# Patient Record
Sex: Female | Born: 1950 | Race: Black or African American | Hispanic: No | Smoking: Never smoker
Health system: Southern US, Community
[De-identification: ages and names within clinical notes are randomized; demographics above are authoritative.]

## PROBLEM LIST (undated history)

## (undated) DIAGNOSIS — Z923 Personal history of irradiation: Secondary | ICD-10-CM

## (undated) DIAGNOSIS — H269 Unspecified cataract: Secondary | ICD-10-CM

## (undated) DIAGNOSIS — R112 Nausea with vomiting, unspecified: Secondary | ICD-10-CM

## (undated) DIAGNOSIS — K219 Gastro-esophageal reflux disease without esophagitis: Secondary | ICD-10-CM

## (undated) DIAGNOSIS — C801 Malignant (primary) neoplasm, unspecified: Secondary | ICD-10-CM

## (undated) DIAGNOSIS — G4730 Sleep apnea, unspecified: Secondary | ICD-10-CM

## (undated) DIAGNOSIS — F419 Anxiety disorder, unspecified: Secondary | ICD-10-CM

## (undated) DIAGNOSIS — T7840XA Allergy, unspecified, initial encounter: Secondary | ICD-10-CM

## (undated) DIAGNOSIS — D649 Anemia, unspecified: Secondary | ICD-10-CM

## (undated) DIAGNOSIS — M1990 Unspecified osteoarthritis, unspecified site: Secondary | ICD-10-CM

## (undated) DIAGNOSIS — I10 Essential (primary) hypertension: Secondary | ICD-10-CM

## (undated) DIAGNOSIS — Z9889 Other specified postprocedural states: Secondary | ICD-10-CM

## (undated) HISTORY — PX: KIDNEY SURGERY: SHX687

## (undated) HISTORY — PX: ABDOMINAL HYSTERECTOMY: SHX81

## (undated) HISTORY — DX: Unspecified cataract: H26.9

## (undated) HISTORY — PX: BACK SURGERY: SHX140

## (undated) HISTORY — DX: Allergy, unspecified, initial encounter: T78.40XA

## (undated) HISTORY — PX: COLONOSCOPY: SHX174

## (undated) HISTORY — PX: FOOT GANGLION EXCISION: SHX1660

## (undated) HISTORY — DX: Anemia, unspecified: D64.9

## (undated) HISTORY — PX: BREAST SURGERY: SHX581

## (undated) HISTORY — PX: FOOT SURGERY: SHX648

---

## 1998-01-17 ENCOUNTER — Other Ambulatory Visit: Admission: RE | Admit: 1998-01-17 | Discharge: 1998-01-17 | Payer: Self-pay | Admitting: Family Medicine

## 1998-01-28 DIAGNOSIS — Z923 Personal history of irradiation: Secondary | ICD-10-CM

## 1998-01-28 HISTORY — DX: Personal history of irradiation: Z92.3

## 1998-01-28 HISTORY — PX: BREAST LUMPECTOMY: SHX2

## 1998-01-28 HISTORY — PX: BREAST SURGERY: SHX581

## 1998-03-08 ENCOUNTER — Encounter: Payer: Self-pay | Admitting: General Surgery

## 1998-03-08 ENCOUNTER — Ambulatory Visit (HOSPITAL_COMMUNITY): Admission: RE | Admit: 1998-03-08 | Discharge: 1998-03-08 | Payer: Self-pay | Admitting: General Surgery

## 1998-03-21 ENCOUNTER — Encounter: Payer: Self-pay | Admitting: General Surgery

## 1998-03-23 ENCOUNTER — Encounter: Payer: Self-pay | Admitting: General Surgery

## 1998-03-23 ENCOUNTER — Ambulatory Visit (HOSPITAL_COMMUNITY): Admission: RE | Admit: 1998-03-23 | Discharge: 1998-03-24 | Payer: Self-pay | Admitting: General Surgery

## 1998-04-05 ENCOUNTER — Encounter: Admission: RE | Admit: 1998-04-05 | Discharge: 1998-07-04 | Payer: Self-pay | Admitting: Radiation Oncology

## 1999-02-26 ENCOUNTER — Other Ambulatory Visit: Admission: RE | Admit: 1999-02-26 | Discharge: 1999-02-26 | Payer: Self-pay | Admitting: Gynecology

## 1999-09-11 ENCOUNTER — Encounter: Payer: Self-pay | Admitting: Gynecology

## 1999-09-17 ENCOUNTER — Encounter (INDEPENDENT_AMBULATORY_CARE_PROVIDER_SITE_OTHER): Payer: Self-pay

## 1999-09-17 ENCOUNTER — Encounter (INDEPENDENT_AMBULATORY_CARE_PROVIDER_SITE_OTHER): Payer: Self-pay | Admitting: Specialist

## 1999-09-17 ENCOUNTER — Inpatient Hospital Stay (HOSPITAL_COMMUNITY): Admission: RE | Admit: 1999-09-17 | Discharge: 1999-09-20 | Payer: Self-pay | Admitting: Gynecology

## 2002-05-10 ENCOUNTER — Other Ambulatory Visit: Admission: RE | Admit: 2002-05-10 | Discharge: 2002-05-10 | Payer: Self-pay | Admitting: Gynecology

## 2004-04-13 ENCOUNTER — Ambulatory Visit: Payer: Self-pay | Admitting: Oncology

## 2005-05-03 ENCOUNTER — Ambulatory Visit: Payer: Self-pay | Admitting: Oncology

## 2006-03-17 ENCOUNTER — Ambulatory Visit: Payer: Self-pay | Admitting: Gastroenterology

## 2006-04-22 ENCOUNTER — Ambulatory Visit: Payer: Self-pay | Admitting: Gastroenterology

## 2006-05-13 ENCOUNTER — Ambulatory Visit: Payer: Self-pay | Admitting: Oncology

## 2006-07-21 ENCOUNTER — Ambulatory Visit: Payer: Self-pay | Admitting: Oncology

## 2006-07-31 ENCOUNTER — Encounter: Admission: RE | Admit: 2006-07-31 | Discharge: 2006-07-31 | Payer: Self-pay | Admitting: Oncology

## 2006-09-12 ENCOUNTER — Ambulatory Visit: Payer: Self-pay | Admitting: Oncology

## 2007-07-14 ENCOUNTER — Encounter: Admission: RE | Admit: 2007-07-14 | Discharge: 2007-07-14 | Payer: Self-pay | Admitting: Oncology

## 2008-05-27 ENCOUNTER — Ambulatory Visit: Payer: Self-pay | Admitting: Vascular Surgery

## 2008-05-27 ENCOUNTER — Ambulatory Visit (HOSPITAL_COMMUNITY): Admission: RE | Admit: 2008-05-27 | Discharge: 2008-05-27 | Payer: Self-pay | Admitting: Orthopedic Surgery

## 2008-05-27 ENCOUNTER — Encounter (INDEPENDENT_AMBULATORY_CARE_PROVIDER_SITE_OTHER): Payer: Self-pay | Admitting: Orthopedic Surgery

## 2008-07-28 ENCOUNTER — Encounter: Admission: RE | Admit: 2008-07-28 | Discharge: 2008-07-28 | Payer: Self-pay | Admitting: Oncology

## 2009-08-02 ENCOUNTER — Encounter: Admission: RE | Admit: 2009-08-02 | Discharge: 2009-08-02 | Payer: Self-pay | Admitting: Gynecology

## 2010-06-15 NOTE — Op Note (Signed)
[facility]  Patient:    Brittany Bender, Brittany Bender                 MRN: [id number] Proc. Date: 09/17/99 Adm. Date:  [id number] Disc. Date: [id number] Attending:  Susa Raring                           Operative Report  PREOPERATIVE DIAGNOSES: 1. Pelvic mass, presumed to be ovarian. 2. History of breast cancer.  POSTOPERATIVE DIAGNOSES: 1. Bilateral hydrosalpinx. 2. Chronic pelvic inflammatory disease. 3. Multiple uterine myomata.  OPERATION:  Total abdominal hysterectomy, bilateral salpingo-oophorectomy, lysis of adhesions.  SURGEON:  Luvenia Redden, M.D.  ASSISTANT:  Brook A. Edward Jolly, M.D.  ANESTHESIA:  General endotracheal.  DESCRIPTION OF PROCEDURE:  Under good anesthesia, the patient was prepped and draped in a sterile manner.  Foley catheter was placed in the bladder and was draining clear urine.  A midline incision was made, carried down to the underlying layers.  Bleeders were clamped and cauterized as encountered.  The anterior fascia was entered vertically and opened.  The rectus muscles spread from the midline.  The peritoneum was entered sharply and the opening then extended vertically.  Pelvic washings were taken, and these were sent to cytology for permanent sections.  Exploration of the upper abdomen revealed no masses; the liver was smooth.  Pelvic peritoneum was smooth.  In the pelvis, there was a large mass which was comprised of a pedunculated fibroid on the fundus of the uterus and several other intramural fibroids present.  There was a large cystic mass in the right adnexal area, and upon examining it, the ovary appeared normal, and on the cystic mass there was a large, distended hydrosalpinx.  On the left side, the left ovary was normal as well.  There was a hydrosalpinx due to tubal occlusion in the left tube as well, although this was of a much smaller degree.  Adhesions were lysed and specimen freed up.  In order to gain  exposure, the intestines were packed off into the upper abdomen with wet lap packs once the retractor had been placed.  Again, in order to gain better exposure, pedunculated myoma was excised, and this was sent as a separate specimen.  Then we were able to adequately visualize the cystic mass on the right side which was even more apparently a hydrosalpinx.  The infundibulopelvic ligament was identified.  The ureter was identified which was well below the operative site.  The fallopian tube and uteroovarian ligaments on the right side were clamped doubly and then divided.  Another clamp was placed on the mesosalpinx so that the ovary and tube were freed up even more, and the infundibulopelvic ligament was isolated.  The ureter was again viewed.  The infundibulopelvic ligament was then clamped, cut, and doubly ligated.  Specimen was sent to pathology for examination, and report was returned as a benign hydrosalpinx and that the ovary was normal. Following this, the left ovary and tube were freed up from their adhesions. The round ligament was clamped, cut, and ligated on both sides, and then the bladder flap was developed by sharp and blunt dissection.  The ureter was identified on the left side.  The infundibulopelvic ligament identified and isolated.  It was clamped, cut, and doubly ligated.  The uterine vessels were skeletonized, clamped, cut, and ligated.  The cardinal ligaments were then clamped, cut, and ligated in two bites.  The uterosacral ligaments  were clamped, cut, and ligated.  The vagina was entered, the cervix circumcised, and the specimen removed.  Lateral vaginal apices were secured with a figure-of-eight Monocryl sutures.  The cuff was then closed with figure-of-eight Monocryl sutures.  The pelvis was irrigated then with copious amounts of warm saline, and this was aspirated.  Small bleeders on the bladder flap were electrocoagulated.  Ureters were again viewed and seen to  be functioning.  Operative site was dry, and there was clear urine coming from the Foley catheter.  The packs were removed, the intestines and omentum placed back down into the lower abdomen and pelvis.  After all of the counts were correct, the peritoneum was closed with a continuous 2-0 Monocryl suture.  The fascia was closed with continuous #1 PDS, and this closure was done halfway with a single suture, and then another suture was used to do the other half. Once the fascia was closed, the subcutaneous area was irrigated again. Bleeders were coagulated.  Subcutaneous space was closed with interrupted 2-0 plain catgut sutures.  The skin edges were then approximated with wide skin staples.  Estimated blood loss was 500 cc.  The patient was draining clear urine at the end of the procedure.  She tolerated the procedure well.  She was removed to recovery in good condition. DD:  10/28/99 TD:  10/28/99 Job: [id number]

## 2010-06-15 NOTE — Discharge Summary (Signed)
[facility]  Patient:    Brittany Bender, Brittany Bender                 MRN: [id number] Adm. Date:  [id number] Disc. Date: [id number] Attending:  Susa Raring                           Discharge Summary  HISTORY OF PRESENT ILLNESS:  This patient is a 60 year old black female admitted to the hospital because of a large cystic pelvic mass.  She has a history of breast cancer dating back several years, and there is no evidence of recurrent breast disease; however, the patient came into the office complaining of a some slight vaginal spotting.  She is taking tamoxifen.  An ultrasound revealed this large cystic mass.  A CA-125 is normal.  The patient was admitted for removal of this pelvic mass.  PERTINENT FINDINGS ON PHYSICAL EXAMINATION:  GENERAL APPEARANCE:  A large black female in no acute distress.  GENITOURINARY:  She had a somewhat palpable mass in her pelvis, but because of the size of this lady it was just difficult to outline it.  The cervix was smooth.  There were no other pertinent findings on physical exam.  LABORATORY AND X-RAY DATA:  On ultrasound her endometrial stripe was normal. Preoperative hemoglobin was 11 with a normal white count and differential, normal platelets.  Postop hemoglobin was 9.6.  Repeat hemoglobin on August 22 was 9.4.  Repeat hemoglobin on August 23 was reported as 11.0.  Differentials were all normal.  Her urinalysis was normal for a voided specimen.  Preop EKG strip was within normal limits, and a chest film was within normal limits.  HOSPITAL COURSE:  The patient was admitted actually through Day Surgery.  She was taken to the operating room where a total abdominal hysterectomy-bilateral salpingo-oophorectomy was performed.  Peritoneal washings were also taken. Frozen section for the large cystic mass showed a benign hydrosalpinx.  The patient tolerated the procedure well.  There was about 500 cc blood loss, and she  did show a drop in her hemoglobin postop.  Postop course was actually fairly benign other than the drop in her hemoglobin.  Everything was pretty benign.  On the first postop day she was getting good pain relief from her morphine, and she was taking some p.o. fluids by August 21.  She did spike a temp up to 101, and she had some nausea due to her morphine, and we changed that to Demerol.  Temp elevation remitted spontaneously, and a CBC white count was within normal limits.  September 19, 1999, she still had scan bowel sounds and taking some fluids orally, but had yet to pass any flatus and was not ambulating well.  However, August 23 she was afebrile.  A repeat CBC and diff showed that it was normal.  Her fever had remitted spontaneously so we felt that was due to atelectasis.  The tissue removed was all benign.  She did have a chronic PID with bilateral hydrosalpinx and multiple uterine leiomyoma.  The endometrium was benign.  The cell block of pelvic washings was also benign.  DISCHARGE INSTRUCTIONS:  The patient was discharged to home with instructions as to limited activity, eat a regular diet, do not lifting, no vaginal penetration.  DISCHARGE MEDICATIONS:  She was given a prescription for [facility] for pain.  SPECIAL INSTRUCTIONS:  I also made arrangements with her to go by her residence in  three days time to remove her skin staples.  FINAL DIAGNOSES: 1. Bilateral benign hydrosalpinx. 2. Chronic pelvic inflammation disease. 3. Multiple uterine leiomyomata. 4. Anemia secondary to blood loss. 5. Posttreatment status for breast cancer.  OPERATIONS PERFORMED: 1. Total abdominal hysterectomy and bilateral salpingo-oophorectomy. 2. Lysis of adhesions. 3. Pelvic washings for cytology.  CONDITION ON DISCHARGE:  Improved. DD:  10/28/99 TD:  10/28/99 Job: [id number]

## 2010-07-09 ENCOUNTER — Other Ambulatory Visit: Payer: Self-pay | Admitting: Oncology

## 2010-07-09 DIAGNOSIS — Z1231 Encounter for screening mammogram for malignant neoplasm of breast: Secondary | ICD-10-CM

## 2010-08-06 ENCOUNTER — Ambulatory Visit
Admission: RE | Admit: 2010-08-06 | Discharge: 2010-08-06 | Disposition: A | Discharge status: Home / Self Care | Payer: BC Managed Care – PPO | Source: Ambulatory Visit | Attending: Oncology | Admitting: Oncology

## 2010-08-06 DIAGNOSIS — Z1231 Encounter for screening mammogram for malignant neoplasm of breast: Secondary | ICD-10-CM

## 2010-11-29 ENCOUNTER — Other Ambulatory Visit: Payer: Self-pay | Admitting: Chiropractic Medicine

## 2010-11-29 ENCOUNTER — Ambulatory Visit
Admission: RE | Admit: 2010-11-29 | Discharge: 2010-11-29 | Disposition: A | Discharge status: Home / Self Care | Payer: BC Managed Care – PPO | Source: Ambulatory Visit | Attending: Chiropractic Medicine | Admitting: Chiropractic Medicine

## 2010-11-29 DIAGNOSIS — M545 Low back pain, unspecified: Secondary | ICD-10-CM

## 2010-12-06 ENCOUNTER — Other Ambulatory Visit: Payer: Self-pay | Admitting: Neurosurgery

## 2010-12-14 ENCOUNTER — Encounter (HOSPITAL_COMMUNITY): Payer: Self-pay

## 2010-12-17 ENCOUNTER — Encounter (HOSPITAL_COMMUNITY)
Admission: RE | Admit: 2010-12-17 | Discharge: 2010-12-17 | Disposition: A | Discharge status: Home / Self Care | Payer: BC Managed Care – PPO | Source: Ambulatory Visit | Attending: Neurosurgery | Admitting: Neurosurgery

## 2010-12-17 ENCOUNTER — Encounter (HOSPITAL_COMMUNITY): Payer: Self-pay

## 2010-12-17 ENCOUNTER — Other Ambulatory Visit (HOSPITAL_COMMUNITY): Payer: Self-pay | Admitting: *Deleted

## 2010-12-17 HISTORY — DX: Other specified postprocedural states: Z98.890

## 2010-12-17 HISTORY — DX: Unspecified osteoarthritis, unspecified site: M19.90

## 2010-12-17 HISTORY — DX: Other specified postprocedural states: R11.2

## 2010-12-17 HISTORY — DX: Gastro-esophageal reflux disease without esophagitis: K21.9

## 2010-12-17 HISTORY — DX: Malignant (primary) neoplasm, unspecified: C80.1

## 2010-12-17 LAB — CBC
MCH: 29.7 pg (ref 26.0–34.0)
MCHC: 32.8 g/dL (ref 30.0–36.0)
Platelets: 238 10*3/uL (ref 150–400)
RBC: 3.74 MIL/uL — ABNORMAL LOW (ref 3.87–5.11)

## 2010-12-17 LAB — SURGICAL PCR SCREEN
MRSA, PCR: NEGATIVE
Staphylococcus aureus: NEGATIVE

## 2010-12-17 NOTE — Pre-Procedure Instructions (Signed)
20 Brittany Bender  12/17/2010   Your procedure is scheduled on:  12/24/2010  Report to [facility] at 5:30 AM.  Call this number if you have problems the morning of surgery: [phone]   Remember: Discontinue Aspirin, Coumadin, Plavix, Effient and herbal medications.    Do not eat food:After Midnight.             You can drink clear liquids (water, soda, tea, black coffee, apple juice:Up to  4 Hours before arrival (1:30 a.m.)  Take these medicines the morning of surgery with A SIP OF WATER: Inhaler-use if needed, Robaxin   Do not wear jewelry, make-up or nail polish.  Do not wear lotions, powders, or perfumes. You may wear deodorant.  Do not shave 48 hours prior to surgery.  Do not bring valuables to the hospital.  Contacts, dentures or bridgework may not be worn into surgery.  Leave suitcase in the car. After surgery it may be brought to your room.  For patients admitted to the hospital, checkout time is 11:00 AM the day of discharge.   Patients discharged the day of surgery will not be allowed to drive home.  Name and phone number of your driver: Berneta Sages [facility] house [phone]  Special Instructions: CHG Shower Use Special Wash: 1/2 bottle night before surgery and 1/2 bottle morning of surgery.   Please read over the following fact sheets that you were given: Pain Booklet, Coughing and Deep Breathing, MRSA Information and Surgical Site Infection Prevention

## 2010-12-17 NOTE — Pre-Procedure Instructions (Signed)
20 Brittany Bender  12/17/2010   Your procedure is scheduled on: 12-24-2010  Report to [facility] at 5:30 AM.  Call this number if you have problems the morning of surgery: [phone]   Remember:   Do not eat food:After Midnight.  Do not drink clear liquids: 4 Hours before arrival.  MAY HAVE WATER, SODA,TEA,BLACK COFFEE,BROTH,APPLE JUICE OR GRAPE JUICE  UNTIL 1:30 AM  Take these medicines the morning of surgery with A SIP OF WATER: ALBUTERAL INHALER IF NEEDED   Do not wear jewelry, make-up or nail polish.  Do not wear lotions, powders, or perfumes. You may wear deodorant.  Do not shave 48 hours prior to surgery.  Do not bring valuables to the hospital.  Contacts, dentures or bridgework may not be worn into surgery.  Leave suitcase in the car. After surgery it may be brought to your room.  For patients admitted to the hospital, checkout time is 11:00 AM the day of discharge.   Patients discharged the day of surgery will not be allowed to drive home.  Name and phone number of your driver:   Special Instructions: CHG Shower Use Special Wash: 1/2 bottle night before surgery and 1/2 bottle morning of surgery.   Please read over the following fact sheets that you were given: Pain Booklet, MRSA Information and Surgical Site Infection Prevention

## 2010-12-23 MED ORDER — CEFAZOLIN SODIUM 1-5 GM-% IV SOLN: 1.0000 g | INTRAVENOUS | Status: DC

## 2010-12-24 ENCOUNTER — Encounter (HOSPITAL_COMMUNITY): Payer: Self-pay | Admitting: Certified Registered"

## 2010-12-24 ENCOUNTER — Encounter (HOSPITAL_COMMUNITY)
Admission: RE | Disposition: A | Discharge status: Home / Self Care | Payer: Self-pay | Source: Ambulatory Visit | Attending: Neurosurgery

## 2010-12-24 ENCOUNTER — Ambulatory Visit (HOSPITAL_COMMUNITY): Payer: BC Managed Care – PPO | Admitting: Certified Registered"

## 2010-12-24 ENCOUNTER — Ambulatory Visit (HOSPITAL_COMMUNITY)
Admission: RE | Admit: 2010-12-24 | Discharge: 2010-12-24 | Disposition: A | Discharge status: Home / Self Care | Payer: BC Managed Care – PPO | Source: Ambulatory Visit | Attending: Neurosurgery | Admitting: Neurosurgery

## 2010-12-24 ENCOUNTER — Ambulatory Visit (HOSPITAL_COMMUNITY): Payer: BC Managed Care – PPO

## 2010-12-24 ENCOUNTER — Encounter (HOSPITAL_COMMUNITY): Payer: Self-pay

## 2010-12-24 DIAGNOSIS — J45909 Unspecified asthma, uncomplicated: Secondary | ICD-10-CM | POA: Insufficient documentation

## 2010-12-24 DIAGNOSIS — M5106 Intervertebral disc disorders with myelopathy, lumbar region: Secondary | ICD-10-CM | POA: Insufficient documentation

## 2010-12-24 DIAGNOSIS — Z01812 Encounter for preprocedural laboratory examination: Secondary | ICD-10-CM | POA: Insufficient documentation

## 2010-12-24 DIAGNOSIS — K219 Gastro-esophageal reflux disease without esophagitis: Secondary | ICD-10-CM | POA: Insufficient documentation

## 2010-12-24 HISTORY — PX: LUMBAR LAMINECTOMY/DECOMPRESSION MICRODISCECTOMY: SHX5026

## 2010-12-24 SURGERY — LUMBAR LAMINECTOMY/DECOMPRESSION MICRODISCECTOMY
Anesthesia: General | Site: Back | Laterality: Right | Wound class: Clean

## 2010-12-24 MED ORDER — CEFAZOLIN SODIUM 1-5 GM-% IV SOLN
1.0000 g | Freq: Three times a day (TID) | INTRAVENOUS | Status: DC
  Administered 2010-12-24: 1 g via INTRAVENOUS
  Filled 2010-12-24: qty 50

## 2010-12-24 MED ORDER — PHENOL 1.4 % MT LIQD: 1.0000 | OROMUCOSAL | Status: DC | PRN

## 2010-12-24 MED ORDER — PROMETHAZINE HCL 25 MG PO TABS: 12.5000 mg | ORAL_TABLET | ORAL | Status: DC | PRN

## 2010-12-24 MED ORDER — SODIUM CHLORIDE 0.9 % IJ SOLN
3.0000 mL | Freq: Two times a day (BID) | INTRAMUSCULAR | Status: DC
  Administered 2010-12-24: 3 mL via INTRAVENOUS

## 2010-12-24 MED ORDER — SODIUM CHLORIDE 0.9 % IR SOLN
Status: DC | PRN
  Administered 2010-12-24: 1000 mL

## 2010-12-24 MED ORDER — KETOROLAC TROMETHAMINE 30 MG/ML IJ SOLN
INTRAMUSCULAR | Status: AC
  Administered 2010-12-24: 30 mg via INTRAVENOUS
  Filled 2010-12-24: qty 1

## 2010-12-24 MED ORDER — HYDROCODONE-ACETAMINOPHEN 5-325 MG PO TABS: 1.0000 | ORAL_TABLET | ORAL | Status: DC | PRN

## 2010-12-24 MED ORDER — PROPOFOL 10 MG/ML IV EMUL
INTRAVENOUS | Status: DC | PRN
  Administered 2010-12-24: 200 mg via INTRAVENOUS

## 2010-12-24 MED ORDER — KCL IN DEXTROSE-NACL 20-5-0.45 MEQ/L-%-% IV SOLN
INTRAVENOUS | Status: DC
  Filled 2010-12-24: qty 1000

## 2010-12-24 MED ORDER — PROMETHAZINE HCL 25 MG/ML IJ SOLN
12.5000 mg | INTRAMUSCULAR | Status: DC | PRN
  Administered 2010-12-24: 12.5 mg via INTRAVENOUS
  Filled 2010-12-24: qty 1

## 2010-12-24 MED ORDER — THERA M PLUS PO TABS
2.0000 | ORAL_TABLET | Freq: Every day | ORAL | Status: DC
  Filled 2010-12-24: qty 2

## 2010-12-24 MED ORDER — HETASTARCH-ELECTROLYTES 6 % IV SOLN
INTRAVENOUS | Status: DC | PRN
  Administered 2010-12-24: 09:00:00 via INTRAVENOUS

## 2010-12-24 MED ORDER — MENTHOL 3 MG MT LOZG: 1.0000 | LOZENGE | OROMUCOSAL | Status: DC | PRN

## 2010-12-24 MED ORDER — KETOROLAC TROMETHAMINE 30 MG/ML IJ SOLN
30.0000 mg | Freq: Four times a day (QID) | INTRAMUSCULAR | Status: DC
  Administered 2010-12-24: 30 mg via INTRAVENOUS
  Filled 2010-12-24: qty 1

## 2010-12-24 MED ORDER — ONDANSETRON HCL 4 MG/2ML IJ SOLN: 4.0000 mg | INTRAMUSCULAR | Status: DC | PRN

## 2010-12-24 MED ORDER — THROMBIN 5000 UNITS EX KIT
PACK | CUTANEOUS | Status: DC | PRN
  Administered 2010-12-24: 10000 [IU] via TOPICAL

## 2010-12-24 MED ORDER — NEOSTIGMINE METHYLSULFATE 1 MG/ML IJ SOLN
INTRAMUSCULAR | Status: DC | PRN
  Administered 2010-12-24: 4 mg via INTRAVENOUS

## 2010-12-24 MED ORDER — GLYCOPYRROLATE 0.2 MG/ML IJ SOLN
INTRAMUSCULAR | Status: DC | PRN
  Administered 2010-12-24: .6 mg via INTRAVENOUS

## 2010-12-24 MED ORDER — CYCLOBENZAPRINE HCL 10 MG PO TABS: 10.0000 mg | ORAL_TABLET | Freq: Three times a day (TID) | ORAL | Status: DC | PRN

## 2010-12-24 MED ORDER — ALUM & MAG HYDROXIDE-SIMETH 400-400-40 MG/5ML PO SUSP
30.0000 mL | Freq: Four times a day (QID) | ORAL | Status: DC | PRN
  Filled 2010-12-24: qty 30

## 2010-12-24 MED ORDER — DOCUSATE SODIUM 100 MG PO CAPS: 100.0000 mg | ORAL_CAPSULE | Freq: Two times a day (BID) | ORAL | Status: DC

## 2010-12-24 MED ORDER — ROCURONIUM BROMIDE 100 MG/10ML IV SOLN
INTRAVENOUS | Status: DC | PRN
  Administered 2010-12-24: 50 mg via INTRAVENOUS
  Administered 2010-12-24: 10 mg via INTRAVENOUS

## 2010-12-24 MED ORDER — CEFAZOLIN SODIUM-DEXTROSE 2-3 GM-% IV SOLR
INTRAVENOUS | Status: AC
  Filled 2010-12-24: qty 50

## 2010-12-24 MED ORDER — HEMOSTATIC AGENTS (NO CHARGE) OPTIME
TOPICAL | Status: DC | PRN
  Administered 2010-12-24: 1 via TOPICAL

## 2010-12-24 MED ORDER — ACETAMINOPHEN 325 MG PO TABS: 650.0000 mg | ORAL_TABLET | ORAL | Status: DC | PRN

## 2010-12-24 MED ORDER — LACTATED RINGERS IV SOLN
INTRAVENOUS | Status: DC | PRN
  Administered 2010-12-24 (×3): via INTRAVENOUS

## 2010-12-24 MED ORDER — ALBUTEROL SULFATE HFA 108 (90 BASE) MCG/ACT IN AERS
2.0000 | INHALATION_SPRAY | Freq: Four times a day (QID) | RESPIRATORY_TRACT | Status: DC | PRN
  Filled 2010-12-24: qty 6.7

## 2010-12-24 MED ORDER — MORPHINE SULFATE 4 MG/ML IJ SOLN: 1.0000 mg | INTRAMUSCULAR | Status: DC | PRN

## 2010-12-24 MED ORDER — PROMETHAZINE HCL 25 MG/ML IJ SOLN: 6.2500 mg | INTRAMUSCULAR | Status: DC | PRN

## 2010-12-24 MED ORDER — METHOCARBAMOL 500 MG PO TABS: 500.0000 mg | ORAL_TABLET | Freq: Four times a day (QID) | ORAL | Status: DC | PRN

## 2010-12-24 MED ORDER — PHENYLEPHRINE HCL 10 MG/ML IJ SOLN
INTRAMUSCULAR | Status: DC | PRN
  Administered 2010-12-24 (×2): 40 ug via INTRAVENOUS
  Administered 2010-12-24: 80 ug via INTRAVENOUS
  Administered 2010-12-24: 40 ug via INTRAVENOUS
  Administered 2010-12-24 (×2): 80 ug via INTRAVENOUS
  Administered 2010-12-24: 40 ug via INTRAVENOUS

## 2010-12-24 MED ORDER — MIDAZOLAM HCL 5 MG/5ML IJ SOLN
INTRAMUSCULAR | Status: DC | PRN
  Administered 2010-12-24: 2 mg via INTRAVENOUS

## 2010-12-24 MED ORDER — ONDANSETRON HCL 4 MG/2ML IJ SOLN
INTRAMUSCULAR | Status: DC | PRN
  Administered 2010-12-24: 4 mg via INTRAVENOUS

## 2010-12-24 MED ORDER — CEFAZOLIN SODIUM-DEXTROSE 2-3 GM-% IV SOLR
2.0000 g | Freq: Once | INTRAVENOUS | Status: AC
  Administered 2010-12-24: 2 g via INTRAVENOUS

## 2010-12-24 MED ORDER — LIDOCAINE-EPINEPHRINE 1 %-1:100000 IJ SOLN
INTRAMUSCULAR | Status: DC | PRN
  Administered 2010-12-24: 10 mL

## 2010-12-24 MED ORDER — KETOROLAC TROMETHAMINE 30 MG/ML IJ SOLN
30.0000 mg | Freq: Four times a day (QID) | INTRAMUSCULAR | Status: DC
  Administered 2010-12-24: 30 mg via INTRAVENOUS

## 2010-12-24 MED ORDER — SODIUM CHLORIDE 0.9 % IJ SOLN: 3.0000 mL | INTRAMUSCULAR | Status: DC | PRN

## 2010-12-24 MED ORDER — FENTANYL CITRATE 0.05 MG/ML IJ SOLN
INTRAMUSCULAR | Status: DC | PRN
  Administered 2010-12-24: 150 ug via INTRAVENOUS
  Administered 2010-12-24: 50 ug via INTRAVENOUS

## 2010-12-24 MED ORDER — ACETAMINOPHEN 650 MG RE SUPP: 650.0000 mg | RECTAL | Status: DC | PRN

## 2010-12-24 MED ORDER — OXYCODONE-ACETAMINOPHEN 5-325 MG PO TABS
1.0000 | ORAL_TABLET | ORAL | Status: DC | PRN
  Administered 2010-12-24: 2 via ORAL
  Filled 2010-12-24: qty 2

## 2010-12-24 MED ORDER — SODIUM CHLORIDE 0.9 % IR SOLN
Status: DC | PRN
  Administered 2010-12-24: 08:00:00

## 2010-12-24 MED ORDER — METHOCARBAMOL 100 MG/ML IJ SOLN
500.0000 mg | Freq: Four times a day (QID) | INTRAVENOUS | Status: DC | PRN
  Filled 2010-12-24: qty 5

## 2010-12-24 SURGICAL SUPPLY — 58 items
APL SKNCLS STERI-STRIP NONHPOA (GAUZE/BANDAGES/DRESSINGS) ×1
BAG DECANTER FOR FLEXI CONT (MISCELLANEOUS) ×2 IMPLANT
BENZOIN TINCTURE PRP APPL 2/3 (GAUZE/BANDAGES/DRESSINGS) ×1 IMPLANT
BLADE SURG ROTATE 9660 (MISCELLANEOUS) IMPLANT
BUR ACORN 6.0 ACORN (BURR) ×2 IMPLANT
BUR ACRON 5.0MM COATED (BURR) IMPLANT
CANISTER SUCTION 2500CC (MISCELLANEOUS) ×2 IMPLANT
CLOTH BEACON ORANGE TIMEOUT ST (SAFETY) ×2 IMPLANT
CONT SPEC 4OZ CLIKSEAL STRL BL (MISCELLANEOUS) IMPLANT
DRAPE LAPAROTOMY 100X72X124 (DRAPES) ×2 IMPLANT
DRAPE MICROSCOPE LEICA (MISCELLANEOUS) ×2 IMPLANT
DRAPE POUCH INSTRU U-SHP 10X18 (DRAPES) ×2 IMPLANT
DRAPE PROXIMA HALF (DRAPES) ×2 IMPLANT
DRESSING TELFA 8X3 (GAUZE/BANDAGES/DRESSINGS) ×1 IMPLANT
DURAPREP 26ML APPLICATOR (WOUND CARE) ×2 IMPLANT
ELECT REM PT RETURN 9FT ADLT (ELECTROSURGICAL) ×2
ELECTRODE REM PT RTRN 9FT ADLT (ELECTROSURGICAL) ×1 IMPLANT
GAUZE SPONGE 4X4 12PLY STRL LF (GAUZE/BANDAGES/DRESSINGS) ×1 IMPLANT
GAUZE SPONGE 4X4 16PLY XRAY LF (GAUZE/BANDAGES/DRESSINGS) IMPLANT
GLOVE BIOGEL PI IND STRL 7.0 (GLOVE) IMPLANT
GLOVE BIOGEL PI INDICATOR 7.0 (GLOVE) ×2
GLOVE ECLIPSE 7.5 STRL STRAW (GLOVE) ×2 IMPLANT
GLOVE ECLIPSE 8.5 STRL (GLOVE) ×1 IMPLANT
GLOVE EXAM NITRILE LRG STRL (GLOVE) IMPLANT
GLOVE EXAM NITRILE MD LF STRL (GLOVE) IMPLANT
GLOVE EXAM NITRILE XL STR (GLOVE) IMPLANT
GLOVE EXAM NITRILE XS STR PU (GLOVE) IMPLANT
GLOVE SURG SS PI 6.5 STRL IVOR (GLOVE) ×3 IMPLANT
GOWN BRE IMP SLV AUR LG STRL (GOWN DISPOSABLE) ×4 IMPLANT
GOWN BRE IMP SLV AUR XL STRL (GOWN DISPOSABLE) IMPLANT
GOWN STRL REIN 2XL LVL4 (GOWN DISPOSABLE) ×1 IMPLANT
KIT BASIN OR (CUSTOM PROCEDURE TRAY) ×2 IMPLANT
KIT ROOM TURNOVER OR (KITS) ×2 IMPLANT
NDL HYPO 18GX1.5 BLUNT FILL (NEEDLE) IMPLANT
NDL SPNL 20GX3.5 QUINCKE YW (NEEDLE) IMPLANT
NEEDLE HYPO 18GX1.5 BLUNT FILL (NEEDLE) IMPLANT
NEEDLE HYPO 22GX1.5 SAFETY (NEEDLE) ×4 IMPLANT
NEEDLE SPNL 20GX3.5 QUINCKE YW (NEEDLE) ×2 IMPLANT
NS IRRIG 1000ML POUR BTL (IV SOLUTION) ×2 IMPLANT
PACK LAMINECTOMY NEURO (CUSTOM PROCEDURE TRAY) ×2 IMPLANT
PAD ARMBOARD 7.5X6 YLW CONV (MISCELLANEOUS) ×6 IMPLANT
PATTIES SURGICAL .75X.75 (GAUZE/BANDAGES/DRESSINGS) ×2 IMPLANT
RUBBERBAND STERILE (MISCELLANEOUS) ×4 IMPLANT
SPONGE GAUZE 4X4 12PLY (GAUZE/BANDAGES/DRESSINGS) IMPLANT
SPONGE LAP 4X18 X RAY DECT (DISPOSABLE) IMPLANT
SPONGE SURGIFOAM ABS GEL SZ50 (HEMOSTASIS) ×1 IMPLANT
STRIP CLOSURE SKIN 1/2X4 (GAUZE/BANDAGES/DRESSINGS) ×1 IMPLANT
SUT PROLENE 6 0 BV (SUTURE) IMPLANT
SUT VIC AB 0 CT1 18XCR BRD8 (SUTURE) ×1 IMPLANT
SUT VIC AB 0 CT1 8-18 (SUTURE) ×2
SUT VIC AB 2-0 CP2 18 (SUTURE) ×2 IMPLANT
SUT VIC AB 3-0 SH 8-18 (SUTURE) ×1 IMPLANT
SYR 20CC LL (SYRINGE) ×2 IMPLANT
SYR 5ML LL (SYRINGE) IMPLANT
TAPE HYPAFIX 4 X10 (GAUZE/BANDAGES/DRESSINGS) ×2 IMPLANT
TOWEL OR 17X24 6PK STRL BLUE (TOWEL DISPOSABLE) ×2 IMPLANT
TOWEL OR 17X26 10 PK STRL BLUE (TOWEL DISPOSABLE) ×2 IMPLANT
WATER STERILE IRR 1000ML POUR (IV SOLUTION) ×2 IMPLANT

## 2010-12-24 NOTE — Preoperative (Signed)
Beta Blockers   Reason not to administer Beta Blockers:Not Applicable 

## 2010-12-24 NOTE — Transfer of Care (Signed)
Immediate Anesthesia Transfer of Care Note  Patient: Brittany Bender  Procedure(s) Performed:  LUMBAR LAMINECTOMY/DECOMPRESSION MICRODISCECTOMY - RIGHT LUMBAR FOUR-FIVE LAMINECTOMY/DISCECTOMY  Patient Location: PACU  Anesthesia Type: General  Level of Consciousness: sedated  Airway & Oxygen Therapy: Patient Spontanous Breathing  Post-op Assessment: Report given to PACU RN stable  Post vital signs:   Complications: No apparent anesthesia complications

## 2010-12-24 NOTE — Anesthesia Postprocedure Evaluation (Signed)
  Anesthesia Post-op Note  Patient: Brittany Bender  Procedure(s) Performed:  LUMBAR LAMINECTOMY/DECOMPRESSION MICRODISCECTOMY - RIGHT LUMBAR FOUR-FIVE LAMINECTOMY/DISCECTOMY  Patient Location: PACU  Anesthesia Type: General  Level of Consciousness: awake, alert  and oriented  Airway and Oxygen Therapy: Patient Spontanous Breathing  Post-op Pain: mild  Post-op Assessment: Post-op Vital signs reviewed, Patient's Cardiovascular Status Stable, Respiratory Function Stable, Patent Airway, No signs of Nausea or vomiting and Pain level controlled  Post-op Vital Signs: stable  Complications: No apparent anesthesia complications

## 2010-12-24 NOTE — Anesthesia Procedure Notes (Signed)
Procedure Name: Intubation Date/Time: 12/24/2010 8:06 AM Performed by: Ellin Goodie Pre-anesthesia Checklist: Patient identified, Emergency Drugs available, Suction available, Patient being monitored and Timeout performed Patient Re-evaluated:Patient Re-evaluated prior to inductionOxygen Delivery Method: Circle System Utilized Preoxygenation: Pre-oxygenation with 100% oxygen Intubation Type: IV induction Ventilation: Mask ventilation without difficulty Laryngoscope Size: Mac and 3 Grade View: Grade I Tube type: Oral Tube size: 7.5 mm Number of attempts: 1 Airway Equipment and Method: stylet Placement Confirmation: ETT inserted through vocal cords under direct vision Secured at: 21 cm Tube secured with: Tape Dental Injury: Teeth and Oropharynx as per pre-operative assessment

## 2010-12-24 NOTE — Anesthesia Preprocedure Evaluation (Addendum)
Anesthesia Evaluation  Patient identified by MRN, date of birth, ID band Patient awake    Reviewed: Allergy & Precautions, H&P , NPO status , Patient's Chart, lab work & pertinent test results, reviewed documented beta blocker date and time   History of Anesthesia Complications (+) PONV and AWARENESS UNDER ANESTHESIA  Airway Mallampati: II TM Distance: >3 FB Neck ROM: Full    Dental  (+) Teeth Intact,    Pulmonary asthma (last used inhaler one week ago, lungs CTA) ,  clear to auscultation  Pulmonary exam normal       Cardiovascular Exercise Tolerance: Good Regular Normal    Neuro/Psych    GI/Hepatic GERD-  Poorly Controlled,  Endo/Other  Morbid obesity  Renal/GU      Musculoskeletal   Abdominal (+) obese,  Abdomen: soft. Bowel sounds: normal.  Peds  Hematology   Anesthesia Other Findings   Reproductive/Obstetrics                       Anesthesia Physical Anesthesia Plan  ASA: III  Anesthesia Plan: General   Post-op Pain Management:    Induction: Intravenous  Airway Management Planned: Oral ETT  Additional Equipment:   Intra-op Plan:   Post-operative Plan: Extubation in OR  Informed Consent: I have reviewed the patients History and Physical, chart, labs and discussed the procedure including the risks, benefits and alternatives for the proposed anesthesia with the patient or authorized representative who has indicated his/her understanding and acceptance.     Plan Discussed with:   Anesthesia Plan Comments:         Anesthesia Quick Evaluation

## 2010-12-24 NOTE — H&P (Signed)
See H& P.

## 2010-12-24 NOTE — Op Note (Signed)
12/24/2010  10:01 AM  PATIENT:  Brittany Bender  60 y.o. 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 Patient prepped and draped in a sterile fashion. Needle was placed in interspace x-rays obtained. He was putting towards the fourth of level and in the an incision was made centered over with a needle was. (10 cc 1% lidocaine with epinephrine was injected the subcutaneous tissues. Incision taken the fascia fascia incised and subperiosteal dissection was done over 2 spinous processes on the right side. Markers placed at interspace and x-ray obtained showing the marker at L3-4 level. We dissect one level of placed obtaining retractor put another marker the interspace another x-ray and they show we are at L 4-5 level. High-speed drill was used to starting hemilaminectomy this was completed with Kerrison punches. Microscope was brought in for microdissection. We explored the epidural space and found free fragments of disc extending from the disc space cephalad into the foramen and these were removed with hooks and micropituitary's. We explored the area the nerve roots were well decompressed. We irrigated with antibiotic solution. We had good hemostasis. Retractors removed fascia closed with 0 Vicryl interrupted sutures. The incision closed with 020 3-0 Vicryl interrupted sutures. Skin closed benzoin Steri-Strips. Dressing was  placed. Patient transferred to 2 a supine position woken from anesthesia and transferred to recovery in stable condition. PLAN OF CARE: Admit for overnight observation  PATIENT DISPOSITION:  PACU - hemodynamically stable.

## 2010-12-24 NOTE — Interval H&P Note (Signed)
History and Physical Interval Note:   12/24/2010   7:46 AM   Brittany Bender  has presented today for surgery, with the diagnosis of LUMBAR HNP WITH MYELOPATHY, LUMBAR RADICULOPATHY RIGHT SIDE  The various methods of treatment have been discussed with the patient and family. After consideration of risks, benefits and other options for treatment, the patient has consented to  Procedure(s): LUMBAR LAMINECTOMY/DECOMPRESSION MICRODISCECTOMY as a surgical intervention .  The patients' history has been reviewed, patient examined, no change in status, stable for surgery.  I have reviewed the patients' chart and labs.  Questions were answered to the patient's satisfaction.     Clydene Fake  MD

## 2010-12-25 ENCOUNTER — Encounter (HOSPITAL_COMMUNITY): Payer: Self-pay | Admitting: Neurosurgery

## 2010-12-25 MED FILL — Sodium Chloride Irrigation Soln 0.9%: Qty: 500 | Status: AC

## 2010-12-27 NOTE — Discharge Summary (Signed)
Physician Discharge Summary  Patient ID: Brittany Bender MRN: [id number] DOB/AGE: 58-Dec-1952 61 y.o.  Admit date: 12/24/2010 Discharge date: 12/27/2010  Admission Diagnoses:LUMBAR HNP WITH MYELOPATHY, LUMBAR RADICULOPATHY RIGHT SIDE    Discharge Diagnoses: same Active Problems:  * No active hospital problems. *    Discharged Condition: good  Hospital Course: uderwent surgery stated above - transferred to floor post op - less leg pain - pt doing well and d/c home  Consults: none  Significant Diagnostic Studies:none  Treatments: surgery: lumabar lam and discectomy  Discharge Exam: Blood pressure 129/85, pulse 101, temperature 97.5 F (36.4 C), temperature source Oral, resp. rate 20, SpO2 92.00%. Wound:c/d/i  Disposition: home   Discharge Medication List as of 12/24/2010  4:53 PM    CONTINUE these medications which have NOT CHANGED   Details  albuterol (PROVENTIL HFA;VENTOLIN HFA) 108 (90 BASE) MCG/ACT inhaler Inhale 2 puffs into the lungs every 6 (six) hours as needed. For shortness of breath. , Until Discontinued, Historical Med    Cholecalciferol (VITAMIN D-3 PO) Take 2 capsules by mouth daily.  , Until Discontinued, Historical Med    Cyanocobalamin (VITAMIN B12 PO) Take 2 tablets by mouth daily.  , Until Discontinued, Historical Med    diclofenac (VOLTAREN) 75 MG EC tablet Take 75 mg by mouth 2 (two) times daily.  , Until Discontinued, Historical Med    fish oil-omega-3 fatty acids 1000 MG capsule Take 1 g by mouth 2 (two) times daily.  , Until Discontinued, Historical Med    polyethylene glycol (MIRALAX / GLYCOLAX) packet Take 17 g by mouth 2 (two) times daily as needed. For constipation , Until Discontinued, Historical Med    BIOTIN PO Take 1 tablet by mouth daily.  , Until Discontinued, Historical Med    Linoleic Acid Conjugated (CLA PO) Take 1 tablet by mouth daily.  , Until Discontinued, Historical Med    Multiple Vitamins-Minerals (MULTIVITAMINS THER.  W/MINERALS) TABS Take 2 tablets by mouth daily.  , Until Discontinued, Historical Med    OVER THE COUNTER MEDICATION Take 1 tablet by mouth 2 (two) times daily. Quick digest , Until Discontinued, Historical Med    PRESCRIPTION MEDICATION Take 1 tablet by mouth every 6 (six) hours as needed. Robaxin-For muscle spasm. , Until Discontinued, Historical Med         Signed: Clydene Fake, MD 12/27/2010, 11:53 AM

## 2011-01-29 HISTORY — PX: KNEE ARTHROSCOPY: SHX127

## 2011-07-15 ENCOUNTER — Other Ambulatory Visit: Payer: Self-pay | Admitting: Internal Medicine

## 2011-07-15 DIAGNOSIS — Z1231 Encounter for screening mammogram for malignant neoplasm of breast: Secondary | ICD-10-CM

## 2011-08-07 ENCOUNTER — Ambulatory Visit: Payer: BC Managed Care – PPO

## 2011-08-12 ENCOUNTER — Ambulatory Visit
Admission: RE | Admit: 2011-08-12 | Discharge: 2011-08-12 | Disposition: A | Discharge status: Home / Self Care | Payer: BC Managed Care – PPO | Source: Ambulatory Visit | Attending: Internal Medicine | Admitting: Internal Medicine

## 2011-08-12 DIAGNOSIS — Z1231 Encounter for screening mammogram for malignant neoplasm of breast: Secondary | ICD-10-CM

## 2011-08-26 ENCOUNTER — Other Ambulatory Visit: Payer: Self-pay

## 2012-07-06 ENCOUNTER — Other Ambulatory Visit: Payer: Self-pay

## 2012-07-06 DIAGNOSIS — Z1231 Encounter for screening mammogram for malignant neoplasm of breast: Secondary | ICD-10-CM

## 2012-08-12 ENCOUNTER — Ambulatory Visit
Admission: RE | Admit: 2012-08-12 | Discharge: 2012-08-12 | Disposition: A | Discharge status: Home / Self Care | Payer: BC Managed Care – PPO | Source: Ambulatory Visit

## 2012-08-12 DIAGNOSIS — Z1231 Encounter for screening mammogram for malignant neoplasm of breast: Secondary | ICD-10-CM

## 2012-10-19 ENCOUNTER — Other Ambulatory Visit: Payer: BC Managed Care – PPO | Admitting: Internal Medicine

## 2012-10-19 DIAGNOSIS — Z130 Encounter for screening for diseases of the blood and blood-forming organs and certain disorders involving the immune mechanism: Secondary | ICD-10-CM

## 2012-10-19 DIAGNOSIS — Z1329 Encounter for screening for other suspected endocrine disorder: Secondary | ICD-10-CM

## 2012-10-19 DIAGNOSIS — Z13220 Encounter for screening for lipoid disorders: Secondary | ICD-10-CM

## 2012-10-19 DIAGNOSIS — Z0000 Encounter for general adult medical examination without abnormal findings: Secondary | ICD-10-CM

## 2012-10-19 LAB — CBC WITH DIFFERENTIAL/PLATELET
Basophils Relative: 1 % (ref 0–1)
Eosinophils Absolute: 0.1 10*3/uL (ref 0.0–0.7)
Eosinophils Relative: 3 % (ref 0–5)
Hemoglobin: 12.1 g/dL (ref 12.0–15.0)
MCH: 30.2 pg (ref 26.0–34.0)
MCHC: 33.9 g/dL (ref 30.0–36.0)
Monocytes Absolute: 0.4 10*3/uL (ref 0.1–1.0)
Monocytes Relative: 10 % (ref 3–12)
Neutrophils Relative %: 42 % — ABNORMAL LOW (ref 43–77)

## 2012-10-19 LAB — COMPREHENSIVE METABOLIC PANEL
Alkaline Phosphatase: 74 U/L (ref 39–117)
BUN: 12 mg/dL (ref 6–23)
Creat: 0.83 mg/dL (ref 0.50–1.10)
Glucose, Bld: 87 mg/dL (ref 70–99)
Sodium: 141 mEq/L (ref 135–145)
Total Bilirubin: 0.4 mg/dL (ref 0.3–1.2)

## 2012-10-19 LAB — LIPID PANEL
HDL: 75 mg/dL (ref >39)
LDL Cholesterol: 146 mg/dL — ABNORMAL HIGH (ref 0–99)
Total CHOL/HDL Ratio: 3.1 Ratio
Triglycerides: 62 mg/dL (ref <150)
VLDL: 12 mg/dL (ref 0–40)

## 2012-10-22 ENCOUNTER — Encounter: Payer: Self-pay | Admitting: Gastroenterology

## 2012-10-22 ENCOUNTER — Encounter: Payer: Self-pay | Admitting: Internal Medicine

## 2012-10-22 ENCOUNTER — Ambulatory Visit (INDEPENDENT_AMBULATORY_CARE_PROVIDER_SITE_OTHER): Payer: BC Managed Care – PPO | Admitting: Internal Medicine

## 2012-10-22 VITALS — BP 136/84 | HR 100 | Temp 98.8°F | Ht 66.0 in | Wt 265.0 lb

## 2012-10-22 DIAGNOSIS — E559 Vitamin D deficiency, unspecified: Secondary | ICD-10-CM

## 2012-10-22 DIAGNOSIS — Z853 Personal history of malignant neoplasm of breast: Secondary | ICD-10-CM | POA: Insufficient documentation

## 2012-10-22 DIAGNOSIS — Z23 Encounter for immunization: Secondary | ICD-10-CM

## 2012-10-22 DIAGNOSIS — Z8739 Personal history of other diseases of the musculoskeletal system and connective tissue: Secondary | ICD-10-CM

## 2012-10-22 DIAGNOSIS — M25519 Pain in unspecified shoulder: Secondary | ICD-10-CM

## 2012-10-22 DIAGNOSIS — E7800 Pure hypercholesterolemia, unspecified: Secondary | ICD-10-CM | POA: Insufficient documentation

## 2012-10-22 DIAGNOSIS — Z8709 Personal history of other diseases of the respiratory system: Secondary | ICD-10-CM

## 2012-10-22 DIAGNOSIS — J309 Allergic rhinitis, unspecified: Secondary | ICD-10-CM

## 2012-10-22 DIAGNOSIS — Z0000 Encounter for general adult medical examination without abnormal findings: Secondary | ICD-10-CM

## 2012-10-22 DIAGNOSIS — M25512 Pain in left shoulder: Secondary | ICD-10-CM

## 2012-10-22 DIAGNOSIS — E669 Obesity, unspecified: Secondary | ICD-10-CM

## 2012-10-22 LAB — POCT URINALYSIS DIPSTICK
Blood, UA: NEGATIVE
Glucose, UA: NEGATIVE
Leukocytes, UA: NEGATIVE
Nitrite, UA: NEGATIVE
Protein, UA: NEGATIVE
Spec Grav, UA: >=1.03
Urobilinogen, UA: NEGATIVE
pH, UA: 5.5

## 2012-10-22 NOTE — Patient Instructions (Addendum)
Try the diet exercise and lose weight. Return in one year for repeat fasting lab work. Had Zostavax vaccine done at pharmacy. We will try to find out when Dr. Mathews Robinsons office did tetanus immunization.

## 2012-10-22 NOTE — Progress Notes (Signed)
Subjective:    Patient ID: Brittany Bender, female    DOB: 03-13-1950, 62 y.o.   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 Patient referred by Lona Kettle.  History of right breast cancer status post lumpectomy by Dr. Kendrick Ranch in 2000  Hysterectomy and bilateral oophorectomy by Dr. bruit 2004  Surgery for herniated disc L4-L5 by Dr. 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 She is divorced. Has a college education. Does not smoke. Drinks beer wine and liquor but not to excess. No children.  Family history: Father died of an asthma attack. Mother died of natural causes with history of dementia. One brother died accidentally as a car fell on him. One sister killed in a motor vehicle accident by drunk driver. Total of 6 brothers living. One sister living with history of MI and smoking. 2 brothers living with history of coronary artery disease status post CABG with history of smoking.    Review of Systems  Constitutional: Negative.   HENT: Negative.   Eyes: Negative.   Respiratory: Negative.   Cardiovascular: Negative.   Gastrointestinal: Negative.   Endocrine: Negative.   Genitourinary: Negative.   Musculoskeletal:       One to 2 week history of left shoulder pain. Denies accident or injury.  Allergic/Immunologic: Negative.   Neurological: Negative.   Hematological: Negative.   Psychiatric/Behavioral: Negative.        Objective:   Physical Exam  Vitals reviewed. Constitutional: She is oriented to person, place, and time. She appears well-developed and well-nourished. No distress.  HENT:  Head: Normocephalic and atraumatic.  Right Ear:  External ear normal.  Left Ear: External ear normal.  Mouth/Throat: Oropharynx is clear and moist. No oropharyngeal exudate.  Eyes: Conjunctivae and EOM are normal. Pupils are equal, round, and reactive to light. Right eye exhibits no discharge. Left eye exhibits no discharge. No scleral icterus.  Neck: Neck supple. No JVD present. No thyromegaly present.  Cardiovascular: Normal rate, regular rhythm, normal heart sounds and intact distal pulses.   No murmur heard. Pulmonary/Chest: Effort normal and breath sounds normal. No respiratory distress. She has no wheezes. She has no rales. She exhibits no tenderness.  Breasts normal female without masses  Abdominal: Soft. Bowel sounds are normal. She exhibits no distension and no mass. There is no tenderness. There is no rebound and no guarding.  Genitourinary:  Deferred to Dr. Greta Doom  Musculoskeletal: She exhibits no edema.  Decreased range of motion left upper extremity secondary to pain. Tender along the biceps  Lymphadenopathy:    She has no cervical adenopathy.  Neurological: She is alert and oriented to person, place, and time. She has normal reflexes. No cranial nerve deficit. Coordination normal.  Skin: Skin is warm and dry. No rash noted. She is not diaphoretic.  Psychiatric: She has a normal mood and affect. Her behavior is normal. Judgment and thought content normal.          Assessment & Plan:  Obesity-needs to diet and exercise  History of asthma-stable  Allergic rhinitis-stable  History of breast cancer  Left shoulder pain-patient is to try Aleve twice daily for 2 weeks and if no improvement refer  to hand surgeon  History of herniated disc L4-L5 and has back pain from time to time for which she takes Flexeril  Plan: Influenza vaccine given today. Try to determine when she last had tetanus immunization from Dr. Mathews Robinsons office. Order given for Zostavax vaccine to be done at pharmacy. Patient had colonoscopy by Dr. Barnet Pall showing diverticulosis on 04/22/2006  Patient also mentioned that sometimes she feels her heart rate is increased to around 100 beats per minute. Patient is to check her pulse on a regular basis and call if pulse is persistently elevated. Tends to come with anxiety.

## 2013-06-22 ENCOUNTER — Other Ambulatory Visit: Payer: Self-pay

## 2013-06-22 DIAGNOSIS — Z1231 Encounter for screening mammogram for malignant neoplasm of breast: Secondary | ICD-10-CM

## 2013-06-29 ENCOUNTER — Encounter: Payer: Self-pay | Admitting: Internal Medicine

## 2013-06-29 ENCOUNTER — Ambulatory Visit (INDEPENDENT_AMBULATORY_CARE_PROVIDER_SITE_OTHER): Payer: BC Managed Care – PPO | Admitting: Internal Medicine

## 2013-06-29 VITALS — BP 140/76 | HR 94 | Temp 98.6°F | Wt 269.0 lb

## 2013-06-29 DIAGNOSIS — R0989 Other specified symptoms and signs involving the circulatory and respiratory systems: Secondary | ICD-10-CM

## 2013-06-29 DIAGNOSIS — R0683 Snoring: Secondary | ICD-10-CM

## 2013-06-29 DIAGNOSIS — R000 Tachycardia, unspecified: Secondary | ICD-10-CM

## 2013-06-29 DIAGNOSIS — R0609 Other forms of dyspnea: Secondary | ICD-10-CM

## 2013-06-29 DIAGNOSIS — E669 Obesity, unspecified: Secondary | ICD-10-CM

## 2013-06-29 NOTE — Progress Notes (Signed)
   Subjective:    Patient ID: Brittany Bender, female    DOB: [birth year], 63 y.o.   MRN: [id number]  HPI  Pt first presented to this office in September 2014. 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 She has a history of right breast cancer status post lumpectomy by Dr. Lennie Hummer in 2000. Says she has a history of elevated pulse. Doesn't notice it until nighttime usually. Says at times it runs up to 110. Denies being anxious. No chest pain. She has a history of asthma and allergic rhinitis. Says she has issues with snoring and would like to have a sleep apnea evaluation. Has not been told that she stops breathing -just snores loudly. No recent EKG. History of elevated LDL cholesterol. Not on statin medication.    Review of Systems     Objective:   Physical Exam Skin warm and dry. Nodes none. Neck is supple without thyromegaly. Chest clear to auscultation. Cardiac exam regular rate and rhythm normal S1 and S2. Extremities without edema. EKG shows sinus rhythm rate 92 without acute changes. Pulse oximetry 97% on room air. Free T4 and TSH pending.        Assessment & Plan:  Snoring  Obesity  Elevated pulse  Plan: Patient is going to be away some in June so we will arrange for a 24-hour Holter monitor and 2-D echocardiogram in July. Free T4 and TSH pending. Arrange pulmonary consultation regarding possible sleep apnea.

## 2013-06-29 NOTE — Patient Instructions (Signed)
We will arrange for 24-hour Holter monitor and 2-D echocardiogram in July. We will arrange for pulmonary consultation.

## 2013-06-30 LAB — TSH: TSH: 1.821 u[IU]/mL (ref 0.350–4.500)

## 2013-06-30 LAB — T4, FREE: Free T4: 1.13 ng/dL (ref 0.80–1.80)

## 2013-07-01 ENCOUNTER — Telehealth: Payer: Self-pay

## 2013-07-01 DIAGNOSIS — R002 Palpitations: Secondary | ICD-10-CM

## 2013-07-01 DIAGNOSIS — R0683 Snoring: Secondary | ICD-10-CM

## 2013-07-01 NOTE — Telephone Encounter (Signed)
Patient scheduled for an appointment with Dr. Gwenette Greet on 08/02/2013 at 11:15 am, and then a 2D Echocardiogram at 2:00pm. She will be called from [facility] and they will schedule her 24 hr holter monitor.

## 2013-07-06 ENCOUNTER — Encounter: Payer: Self-pay | Admitting: *Deleted

## 2013-07-06 ENCOUNTER — Ambulatory Visit (INDEPENDENT_AMBULATORY_CARE_PROVIDER_SITE_OTHER): Payer: BC Managed Care – PPO | Admitting: Radiology

## 2013-07-06 DIAGNOSIS — R002 Palpitations: Secondary | ICD-10-CM

## 2013-07-06 NOTE — Progress Notes (Signed)
Patient ID: Brittany Bender, female   DOB: 1950/07/16, 63 y.o.   MRN: [id number] E-Cardio 24 hour holter monitor applied to patient.

## 2013-07-06 NOTE — Progress Notes (Signed)
Patient ID: Brittany Bender, female   DOB: Feb 16, 1950, 63 y.o.   MRN: [id number] E-cardio 24 hour holter monitor applied to patient.

## 2013-07-15 ENCOUNTER — Telehealth: Payer: Self-pay

## 2013-07-15 DIAGNOSIS — I471 Supraventricular tachycardia: Secondary | ICD-10-CM

## 2013-07-15 DIAGNOSIS — I493 Ventricular premature depolarization: Secondary | ICD-10-CM

## 2013-07-15 NOTE — Telephone Encounter (Signed)
Spoke with patient re: 24 hr holter monitor results. Cardiology referral in [place]. To be scheduled

## 2013-07-16 ENCOUNTER — Other Ambulatory Visit: Payer: Self-pay

## 2013-07-16 MED ORDER — METOPROLOL TARTRATE 25 MG PO TABS: 25.0000 mg | ORAL_TABLET | Freq: Every day | ORAL | Status: DC

## 2013-07-16 NOTE — Telephone Encounter (Signed)
Patient is worried about waiting so long for an appointment with cardiologist. Concerned that her rapid heart rate may cause some problems. Per verbal order of Dr. Renold Genta, start Metoprolol 25mg  daily. Rx sent to [facility]. Patient aware.

## 2013-07-16 NOTE — Telephone Encounter (Signed)
Patient scheduled for an appointment with Dr. Debara Pickett on 08/17/2013 at 2:30pm. Advised of this.

## 2013-07-22 ENCOUNTER — Other Ambulatory Visit (HOSPITAL_COMMUNITY): Payer: BC Managed Care – PPO

## 2013-07-22 ENCOUNTER — Ambulatory Visit (HOSPITAL_COMMUNITY): Payer: BC Managed Care – PPO | Attending: Internal Medicine | Admitting: Radiology

## 2013-07-22 DIAGNOSIS — R002 Palpitations: Secondary | ICD-10-CM | POA: Insufficient documentation

## 2013-07-22 NOTE — Progress Notes (Signed)
Echocardiogram performed.  

## 2013-07-22 NOTE — Progress Notes (Signed)
Patient informed. 

## 2013-08-02 ENCOUNTER — Encounter (INDEPENDENT_AMBULATORY_CARE_PROVIDER_SITE_OTHER): Payer: Self-pay

## 2013-08-02 ENCOUNTER — Ambulatory Visit (INDEPENDENT_AMBULATORY_CARE_PROVIDER_SITE_OTHER): Payer: BC Managed Care – PPO | Admitting: Pulmonary Disease

## 2013-08-02 ENCOUNTER — Encounter: Payer: Self-pay | Admitting: Pulmonary Disease

## 2013-08-02 ENCOUNTER — Other Ambulatory Visit (HOSPITAL_COMMUNITY): Payer: BC Managed Care – PPO

## 2013-08-02 VITALS — BP 136/82 | HR 79 | Temp 97.7°F | Ht 66.0 in | Wt 273.6 lb

## 2013-08-02 DIAGNOSIS — G4733 Obstructive sleep apnea (adult) (pediatric): Secondary | ICD-10-CM | POA: Insufficient documentation

## 2013-08-02 NOTE — Progress Notes (Signed)
Subjective:    Patient ID: Brittany Bender, female    DOB: July 18, 1950, 63 y.o.   MRN: [id number]  HPI The patient is a 63 year old female who I've been asked to see for possible obstructive sleep apnea. She has been noted to have loud snoring, but no one has ever mentioned an abnormal breathing pattern during sleep. She has frequent awakenings at night, but often blames this on hot flashes. She is not rested the majority of the mornings whenever she arises, but denies any significant daytime sleep pressure or sleepiness. However, during the summer, she takes a nap every day at 2 PM. 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 She denies any sleepiness with driving. The patient states that her weight is up 10 pounds over the last 2 years, and her Epworth score today is 7. Also of note, the patient states that she will have palpitations at times, and is currently undergoing a cardiac evaluation.    Sleep Questionnaire What time do you typically go to bed?( Between what hours) 8:30-0qm 8:30-0qm at 1137 on 08/02/13 by Lilli Few, CMA How long does it take you to fall asleep? 30 minutes 30 minutes at 1137 on 08/02/13 by Lilli Few, CMA How many times during the night do you wake up? 5 5 at 1137 on 08/02/13 by Lilli Few, CMA What time do you get out of bed to start your day? 0530 0530 at 1137 on 08/02/13 by Lilli Few, CMA Do you drive or operate heavy machinery in your occupation? No No at 1137 on 08/02/13 by Lilli Few, CMA How much has your weight changed (up or down) over the past two years? (In pounds) 10 lb (4.536 kg) 10 lb (4.536 kg) at 1137 on 08/02/13 by Lilli Few, CMA Have you ever had a sleep study before? No No at 1137 on 08/02/13 by Lilli Few, CMA Do you currently use CPAP? No No at 1137 on 08/02/13 by Lilli Few, CMA Do you wear oxygen at any time? No No at 1137 on 08/02/13 by Lilli Few, CMA   Review of Systems  Constitutional: Negative for fever and unexpected weight change.  HENT: Negative for congestion, dental problem, ear pain, nosebleeds, postnasal drip, rhinorrhea, sinus pressure, sneezing, sore throat and trouble swallowing.   Eyes: Negative for redness and itching.  Respiratory: Negative for cough, chest tightness, shortness of breath and wheezing.   Cardiovascular: Positive for palpitations. Negative for leg swelling.  Gastrointestinal: Negative for nausea and vomiting.  Genitourinary: Negative for dysuria.  Musculoskeletal: Positive for arthralgias. Negative for joint swelling.  Skin: Negative for rash.  Neurological: Negative for headaches.  Hematological: Does not bruise/bleed easily.  Psychiatric/Behavioral: Negative for dysphoric mood. The patient is not nervous/anxious.        Objective:   Physical Exam Constitutional:  Obese female, no acute distress  HENT:  Nares patent without discharge, but enlarged turbinates  Oropharynx without exudate, palate and uvula are mildly elongated  Eyes:  Perrla, eomi, no scleral icterus  Neck:  No JVD, no TMG  Cardiovascular:  Normal rate, regular rhythm, no rubs or gallops.  No murmurs        Intact distal pulses  Pulmonary :  Normal breath sounds, no stridor or respiratory distress   No rales, rhonchi, or wheezing  Abdominal:  Soft, nondistended, bowel sounds  present.  No tenderness noted.   Musculoskeletal:  mild lower extremity edema noted.  Lymph Nodes:  No cervical lymphadenopathy noted  Skin:  No cyanosis noted  Neurologic:  Appears mildly sleepy, but appropriate, moves all 4 extremities without obvious deficit.         Assessment & Plan:

## 2013-08-02 NOTE — Assessment & Plan Note (Signed)
The patient's history is suggestive of sleep disordered breathing at night, but she does not have any of the classic symptoms during the day. It may be that she is more sleepy than she realizes, and she is very concerned about the possibility of sleep apnea. I have had a long discussion with her about the pathophysiology of sleep disordered breathing, including its impact to quality of life and cardiovascular health. I think she needs a sleep study for diagnosis, and the patient is agreeable to this approach.

## 2013-08-02 NOTE — Patient Instructions (Signed)
Will schedule for sleep study, and arrange for followup once the results are available. Work on weight loss.

## 2013-08-13 ENCOUNTER — Encounter (INDEPENDENT_AMBULATORY_CARE_PROVIDER_SITE_OTHER): Payer: Self-pay

## 2013-08-13 ENCOUNTER — Ambulatory Visit
Admission: RE | Admit: 2013-08-13 | Discharge: 2013-08-13 | Disposition: A | Discharge status: Home / Self Care | Payer: BC Managed Care – PPO | Source: Ambulatory Visit

## 2013-08-13 DIAGNOSIS — Z1231 Encounter for screening mammogram for malignant neoplasm of breast: Secondary | ICD-10-CM

## 2013-08-16 ENCOUNTER — Other Ambulatory Visit: Payer: Self-pay | Admitting: Internal Medicine

## 2013-08-16 DIAGNOSIS — R928 Other abnormal and inconclusive findings on diagnostic imaging of breast: Secondary | ICD-10-CM

## 2013-08-17 ENCOUNTER — Institutional Professional Consult (permissible substitution): Payer: BC Managed Care – PPO | Admitting: Internal Medicine

## 2013-08-30 ENCOUNTER — Encounter (INDEPENDENT_AMBULATORY_CARE_PROVIDER_SITE_OTHER): Payer: Self-pay

## 2013-08-30 ENCOUNTER — Ambulatory Visit
Admission: RE | Admit: 2013-08-30 | Discharge: 2013-08-30 | Disposition: A | Discharge status: Home / Self Care | Payer: BC Managed Care – PPO | Source: Ambulatory Visit | Attending: Internal Medicine | Admitting: Internal Medicine

## 2013-08-30 ENCOUNTER — Other Ambulatory Visit: Payer: Self-pay | Admitting: Internal Medicine

## 2013-08-30 ENCOUNTER — Ambulatory Visit (INDEPENDENT_AMBULATORY_CARE_PROVIDER_SITE_OTHER): Payer: BC Managed Care – PPO | Admitting: Interventional Cardiology

## 2013-08-30 ENCOUNTER — Encounter: Payer: Self-pay | Admitting: Interventional Cardiology

## 2013-08-30 VITALS — BP 156/95 | HR 92 | Ht 66.0 in | Wt 273.4 lb

## 2013-08-30 DIAGNOSIS — R079 Chest pain, unspecified: Secondary | ICD-10-CM

## 2013-08-30 DIAGNOSIS — R928 Other abnormal and inconclusive findings on diagnostic imaging of breast: Secondary | ICD-10-CM

## 2013-08-30 NOTE — Progress Notes (Signed)
Patient ID: Brittany Bender, female   DOB: 07-31-1950, 63 y.o.   MRN: [id number]   Date: 08/30/2013 ID: Brittany Bender, DOB [birth year], MRN [id number] PCP: Elby Showers, MD  Reason: Fast heart rate  ASSESSMENT;  1. Tachycardia, with average heart rate of 100 beats per minute on a 24-hour Holter monitor done earlier this summer. Heart rates as fast as 148 beats per minute with typical activity, not exercise 2. Chest discomfort 3. Obesity 4. Snoring 5. Physical deconditioning  PLAN:  1. Exercise treadmill test to assess heart rate response to exercise and to rule out underlying coronary artery disease given the strong family history. Beta blocker the evening prior 2. Discontinue Celebrex 3. If the stress test is unremarkable, bowel recommend aerobic exercise   SUBJECTIVE: Brittany Bender is a 63 y.o. female who is here for evaluation because of elevated heart rate. She has no cardiopulmonary complaints. She feels tired all the time. This complaint was evaluated by Dr. Renold Genta. An echocardiogram was normal. EF was 65-70%. She has not had syncope. She denies orthopnea and PND.   No Known Allergies  Current Outpatient Prescriptions on File Prior to Visit  Medication Sig Dispense Refill  . albuterol (PROVENTIL HFA;VENTOLIN HFA) 108 (90 BASE) MCG/ACT inhaler Inhale 2 puffs into the lungs every 6 (six) hours as needed. For shortness of breath.       [name] BIOTIN PO Take 1 tablet by mouth daily.        . budesonide-formoterol (SYMBICORT) 80-4.5 MCG/ACT inhaler Inhale 2 puffs into the lungs as needed.      . Cholecalciferol (VITAMIN D-3 PO) Take 2 capsules by mouth daily.        . Cyanocobalamin (VITAMIN B12 PO) Take 2 tablets by mouth daily.        . diclofenac (VOLTAREN) 75 MG EC tablet Take 75 mg by mouth as needed.       . fish oil-omega-3 fatty acids 1000 MG capsule Take 1 g by mouth 2 (two) times daily.        [name] levocetirizine (XYZAL) 5 MG tablet Take 5 mg by mouth every evening.      .  Linoleic Acid Conjugated (CLA PO) Take 1 tablet by mouth daily.        . metoprolol tartrate (LOPRESSOR) 25 MG tablet Take 1 tablet (25 mg total) by mouth daily.  30 tablet  1  . montelukast (SINGULAIR) 10 MG tablet Take 10 mg by mouth as needed.       . Multiple Vitamins-Minerals (MULTIVITAMINS THER. W/MINERALS) TABS Take 2 tablets by mouth daily.        [name] OVER THE COUNTER MEDICATION Take 1 tablet by mouth as needed. Quick digest      . polyethylene glycol (MIRALAX / GLYCOLAX) packet Take 17 g by mouth 2 (two) times daily as needed. For constipation       . PRESCRIPTION MEDICATION Take 1 tablet by mouth every 6 (six) hours as needed. Robaxin-For muscle spasm.        No current facility-administered medications on file prior to visit.    Past Medical History  Diagnosis Date  . PONV (postoperative nausea and vomiting)   . Asthma     last SOB was back in the summer, Never had "attack"  . Cancer     right breast cancer 2000-had radiation  . Arthritis     all over  . GERD (gastroesophageal reflux disease)     Past Surgical History  Procedure  Laterality Date  . Breast surgery      right lumpectomy 2000  . Abdominal hysterectomy      2004  . Foot ganglion excision      right and left at different times  . Foot surgery      3 yrs ago broke left foot had hardware inserted and then had to have it removed  . Lumbar laminectomy/decompression microdiscectomy  12/24/2010    Procedure: LUMBAR LAMINECTOMY/DECOMPRESSION MICRODISCECTOMY;  Surgeon: Otilio Connors;  Location: [facility];  Service: Neurosurgery;  Laterality: Right;  RIGHT LUMBAR FOUR-FIVE LAMINECTOMY/DISCECTOMY    History   Social History  . Marital Status: Divorced    Spouse Name: N/A    Number of Children: N/A  . Years of Education: N/A   Occupational History  . teacher    Social History Main Topics  . Smoking status: Never Smoker   . Smokeless tobacco: Not on file  . Alcohol Use: Yes     Comment: occasional  .  Drug Use: Not on file  . Sexual Activity: Not on file   Other Topics Concern  . Not on file   Social History Narrative  . No narrative on file    Family History  Problem Relation Age of Onset  . Asthma Father   . Heart disease Sister     heart attack x 2  . Heart disease Brother     bypass    ROS: No melena or bright red blood per. Denies neurological symptoms. Denies chest pain. Snores. Feels fatigued but not excessively sleepy. No edema. No history of pulmonary emboli. No thyroid disease. No prior history of hypertension.. Other systems negative for complaints.  OBJECTIVE: BP 156/95  Pulse 92  Ht 5\' 6"  (1.676 m)  Wt 273 lb 6.4 oz (124.013 kg)  BMI 44.15 kg/m2,  General: No acute distress, obese HEENT: normal without jaundice or pallor Neck: JVD flat. Carotids absent Chest: Clear Cardiac: Murmur: None. Gallop: None. Rhythm: Normal. Other: None Abdomen: Bruit: Absent. Pulsation: Absent Extremities: Edema: Absent . Pulses: 2+ and symmetric Neuro: Normal Psych: Normal  ECG: Normal  ECHOCARDIOGRAM: June 2015 Study Conclusions  - Left ventricle: The cavity size was normal. Wall thickness was normal. Systolic function was vigorous. The estimated ejection fraction was in the range of 65% to 70%. Doppler parameters are consistent with abnormal left ventricular relaxation (grade 1 diastolic dysfunction).

## 2013-08-30 NOTE — Patient Instructions (Signed)
Your physician recommends that you continue on your current medications as directed. Please refer to the Current Medication list given to you today.  Your physician has requested that you have an exercise tolerance test. For further information please visit [email or web address]. Please also follow instruction sheet, as given.  PRN follow up depending on Treadmill Stress test.

## 2013-08-31 ENCOUNTER — Ambulatory Visit (HOSPITAL_COMMUNITY)
Admission: RE | Admit: 2013-08-31 | Discharge: 2013-08-31 | Disposition: A | Discharge status: Home / Self Care | Payer: BC Managed Care – PPO | Source: Ambulatory Visit | Attending: Internal Medicine | Admitting: Internal Medicine

## 2013-08-31 DIAGNOSIS — R079 Chest pain, unspecified: Secondary | ICD-10-CM

## 2013-08-31 NOTE — Procedures (Signed)
Exercise Treadmill Test   Test  Exercise Tolerance Test Ordering MD: Belva Crome, III    Unique Test No:1   Treadmill:  1  Indication for ETT: chest pain - rule out ischemia  Contraindication to ETT: No   Stress Modality: exercise - treadmill  Cardiac Imaging Performed: non   Protocol: standard Bruce - maximal  Max BP:  220/66  Max MPHR (bpm):  159 85% MPR (bpm):  135  MPHR obtained (bpm):  173 % MPHR obtained:  108  Reached 85% MPHR (min:sec):  1:45  Total Exercise Time (min-sec):  3:39  Workload in METS:  5.4 Borg Scale: 15  Reason ETT Terminated:  SOB and fatigue    ST Segment Analysis At Rest: normal ST segments - no evidence of significant ST depression With Exercise: borderline ST changes  Other Information Arrhythmia:  No Angina during ETT:  absent (0) Quality of ETT:  diagnostic  ETT Interpretation:  borderline (indeterminate) with non-specific ST changes  Comments: 1-2 mm horizontal to upsloping inferior ST depression at peak exercise No chest pain, but fatigue and poor exercise tolerance Marked hypertensive response to exercise up to 220/66  Recommendations: Consider additional imaging such as a nuclear perfusion study given equivocal findings.  Pixie Casino, MD, [facility] Attending Cardiologist [place]

## 2013-09-03 ENCOUNTER — Telehealth: Payer: Self-pay

## 2013-09-03 NOTE — Telephone Encounter (Signed)
called to give pt stress test results and Dr.Smith recommendations. lmtcb

## 2013-09-03 NOTE — Telephone Encounter (Signed)
Message copied by Lamar Laundry on Fri Sep 03, 2013  2:13 PM ------      Message from: Daneen Schick      Created: Tue Aug 31, 2013  7:10 PM       Extremely deconditioned. Double the metoprolol dose. Begin gentle aerobic activity and f/u in 4-6 weeks ------

## 2013-09-06 ENCOUNTER — Other Ambulatory Visit: Payer: Self-pay | Admitting: Internal Medicine

## 2013-09-06 DIAGNOSIS — R921 Mammographic calcification found on diagnostic imaging of breast: Secondary | ICD-10-CM

## 2013-09-07 ENCOUNTER — Other Ambulatory Visit: Payer: Self-pay | Admitting: Internal Medicine

## 2013-09-07 ENCOUNTER — Ambulatory Visit
Admission: RE | Admit: 2013-09-07 | Discharge: 2013-09-07 | Disposition: A | Discharge status: Home / Self Care | Payer: BC Managed Care – PPO | Source: Ambulatory Visit | Attending: Internal Medicine | Admitting: Internal Medicine

## 2013-09-07 DIAGNOSIS — R921 Mammographic calcification found on diagnostic imaging of breast: Secondary | ICD-10-CM

## 2013-09-07 HISTORY — PX: BREAST BIOPSY: SHX20

## 2013-09-07 NOTE — Telephone Encounter (Signed)
Follow up ° ° ° ° ° °Calling to get stress test results °

## 2013-09-08 ENCOUNTER — Other Ambulatory Visit: Payer: Self-pay | Admitting: Internal Medicine

## 2013-09-08 NOTE — Telephone Encounter (Signed)
returned pt call. called to give pt gxt and Dr.Smith instructions.lmtcb

## 2013-09-08 NOTE — Telephone Encounter (Signed)
pt is currently taking Metoprol tarttrate 25mg  daily.adv pt I will send Dr.Smith a message to verirfy Metoprolol increase.once onfirmed I will send Rx in to pt pharmacy and call pt to make her aware of metoprolol dosage and f/u appt.per pt ok to lmom.

## 2013-09-08 NOTE — Telephone Encounter (Signed)
Message copied by Lamar Laundry on Wed Sep 08, 2013  9:04 AM ------      Message from: Daneen Schick      Created: Tue Aug 31, 2013  7:10 PM       Extremely deconditioned. Double the metoprolol dose. Begin gentle aerobic activity and f/u in 4-6 weeks ------

## 2013-09-08 NOTE — Telephone Encounter (Signed)
Metoprolol 25 mg BID

## 2013-09-09 ENCOUNTER — Telehealth: Payer: Self-pay | Admitting: Interventional Cardiology

## 2013-09-09 MED ORDER — METOPROLOL TARTRATE 25 MG PO TABS: 25.0000 mg | ORAL_TABLET | Freq: Two times a day (BID) | ORAL | Status: DC

## 2013-09-09 NOTE — Telephone Encounter (Signed)
ok per pt to lmom. Increase metoprolol to 25mg  bid.Rx sent to pt pharmacy.f/u appt sch for 9/22 @8 :45am

## 2013-09-17 ENCOUNTER — Ambulatory Visit (HOSPITAL_BASED_OUTPATIENT_CLINIC_OR_DEPARTMENT_OTHER): Payer: BC Managed Care – PPO | Attending: Pulmonary Disease

## 2013-09-17 VITALS — Ht 66.0 in | Wt 265.0 lb

## 2013-09-17 DIAGNOSIS — I498 Other specified cardiac arrhythmias: Secondary | ICD-10-CM | POA: Diagnosis not present

## 2013-09-17 DIAGNOSIS — G4733 Obstructive sleep apnea (adult) (pediatric): Secondary | ICD-10-CM | POA: Diagnosis present

## 2013-09-21 ENCOUNTER — Other Ambulatory Visit: Payer: Self-pay | Admitting: Neurosurgery

## 2013-09-21 DIAGNOSIS — M5412 Radiculopathy, cervical region: Secondary | ICD-10-CM

## 2013-09-28 DIAGNOSIS — G4710 Hypersomnia, unspecified: Secondary | ICD-10-CM

## 2013-09-28 DIAGNOSIS — G4730 Sleep apnea, unspecified: Secondary | ICD-10-CM

## 2013-09-28 NOTE — Progress Notes (Signed)
Pt needs ov to review sleep study.  Thanks.  

## 2013-09-28 NOTE — Sleep Study (Signed)
   NAME: Brittany Bender DATE OF BIRTH:  Feb 03, 1950 MEDICAL RECORD NUMBER [id number]  LOCATION: [facility]  PHYSICIAN: [place] OF STUDY: 09/17/2013  SLEEP STUDY TYPE: Nocturnal Polysomnogram               REFERRING PHYSICIAN: Hani Patnode, Armando Reichert, MD  INDICATION FOR STUDY: Hypersomnia with sleep apnea  EPWORTH SLEEPINESS SCORE:  8 HEIGHT: 5\' 6"  (167.6 cm)  WEIGHT: 265 lb (120.203 kg)    Body mass index is 42.79 kg/(m^2).  NECK SIZE: 15 in.  MEDICATIONS: Reviewed in the sleep record  SLEEP ARCHITECTURE: The patient had a total sleep time of 256 minutes, with decreased slow-wave sleep and REM. Sleep onset latency was prolonged at 85 minutes, and REM onset was prolonged at 169 minutes. Sleep efficiency was poor at 67 minutes.  RESPIRATORY DATA: The patient was found to have 71 apneas and 23 obstructive hypopneas, giving her an AHI of 22 events per hour. The events occurred in all body positions, but were increased during REM. There was very loud snoring noted throughout.  OXYGEN DATA: There was transient oxygen desaturation as low as 80% with the patient's obstructive events  CARDIAC DATA: Mild tachycardia noted during the night at times.  MOVEMENT/PARASOMNIA: The patient did not have significant limb movements or other abnormal behaviors seen.  IMPRESSION/ RECOMMENDATION:    1) moderate obstructive sleep apnea/hypopnea syndrome, with an AHI of 22 events per hour and oxygen desaturation as low as 80%. Treatment for this degree of sleep apnea should focus on a trial of weight loss, as well as CPAP or dental appliance. Clinical correlation is suggested.  2) mild sinus tachycardia noted at times during the night, but no clinically significant arrhythmias were seen.     Kathee Delton Diplomate, American Board of Sleep Medicine  ELECTRONICALLY SIGNED ON:  09/28/2013, 6:00 PM [place] PH: [phone]   FX: [phone] [place]

## 2013-09-29 NOTE — Progress Notes (Signed)
[email or web address].Jennifer Castillo, CMA  

## 2013-09-30 NOTE — Progress Notes (Signed)
LMTCBx2. Brittany Bender, CMA  

## 2013-10-01 ENCOUNTER — Telehealth: Payer: Self-pay | Admitting: Pulmonary Disease

## 2013-10-01 NOTE — Telephone Encounter (Signed)
Progress Notes     Kathee Delton, MD at 09/28/2013 6:03 PM     Status: Signed        Pt needs ov to review sleep study. Thanks.         Lilli Few, CMA at 09/29/2013 11:59 AM     Status: Signed        LMTCBx1. Fidelity Bing, [place] Castillo, CMA at 09/30/2013 9:59 AM     Status: Signed        [email or web address].Halma Bing, CMA    Called spoke with pt. appt scheduled for 10/05/13

## 2013-10-05 ENCOUNTER — Encounter: Payer: Self-pay | Admitting: Pulmonary Disease

## 2013-10-05 ENCOUNTER — Ambulatory Visit (INDEPENDENT_AMBULATORY_CARE_PROVIDER_SITE_OTHER): Payer: BC Managed Care – PPO | Admitting: Pulmonary Disease

## 2013-10-05 VITALS — BP 126/78 | HR 106 | Ht 66.0 in | Wt 270.0 lb

## 2013-10-05 DIAGNOSIS — G4733 Obstructive sleep apnea (adult) (pediatric): Secondary | ICD-10-CM

## 2013-10-05 NOTE — Patient Instructions (Signed)
Will start on cpap for your moderate sleep apnea.  Please call if having tolerance issues. Work on weight loss followup with me again in 8 weeks.

## 2013-10-05 NOTE — Assessment & Plan Note (Signed)
The patient has moderate obstructive sleep apnea by her recent study, and I have discussed a conservative approach with a trial of weight loss alone versus more aggressive approach with a trial of CPAP. After a long discussion, she would like to try CPAP while working on weight loss. I will set the patient up on cpap at a moderate pressure level to allow for desensitization, and will troubleshoot the device over the next 4-6weeks if needed.  The pt is to call me if having issues with tolerance.  Will then optimize the pressure once patient is able to wear cpap on a consistent basis.

## 2013-10-05 NOTE — Progress Notes (Signed)
   Subjective:    Patient ID: Brittany Bender, female    DOB: 05-04-1950, 63 y.o.   MRN: [id number]  HPI Patient comes in today for followup of her recent sleep study. She was found to have moderate OSA, with an AHI of 22 events per hour. I have reviewed her study in detail with her, and answered all of her questions.   Review of Systems  Constitutional: Negative for fever and unexpected weight change.  HENT: Positive for sinus pressure. Negative for congestion, dental problem, ear pain, nosebleeds, postnasal drip, rhinorrhea, sneezing, sore throat and trouble swallowing.   Eyes: Negative for redness and itching.  Respiratory: Negative for cough, chest tightness, shortness of breath and wheezing.   Cardiovascular: Negative for palpitations and leg swelling.  Gastrointestinal: Negative for nausea and vomiting.  Genitourinary: Negative for dysuria.  Musculoskeletal: Negative for joint swelling.  Skin: Negative for rash.  Neurological: Negative for headaches.  Hematological: Does not bruise/bleed easily.  Psychiatric/Behavioral: Negative for dysphoric mood. The patient is not nervous/anxious.        Objective:   Physical Exam Obese female in no acute distress Nose without purulence or discharge noted Neck without lymphadenopathy or thyromegaly Lower extremities with mild edema, no cyanosis Alert and oriented, moves all 4 extremities.       Assessment & Plan:

## 2013-10-09 ENCOUNTER — Ambulatory Visit
Admission: RE | Admit: 2013-10-09 | Discharge: 2013-10-09 | Disposition: A | Discharge status: Home / Self Care | Payer: BC Managed Care – PPO | Source: Ambulatory Visit | Attending: Neurosurgery | Admitting: Neurosurgery

## 2013-10-09 DIAGNOSIS — M5412 Radiculopathy, cervical region: Secondary | ICD-10-CM

## 2013-10-19 ENCOUNTER — Ambulatory Visit (INDEPENDENT_AMBULATORY_CARE_PROVIDER_SITE_OTHER): Payer: BC Managed Care – PPO | Admitting: Interventional Cardiology

## 2013-10-19 ENCOUNTER — Encounter: Payer: Self-pay | Admitting: Interventional Cardiology

## 2013-10-19 VITALS — BP 132/84 | HR 99 | Ht 66.0 in | Wt 270.8 lb

## 2013-10-19 DIAGNOSIS — E7800 Pure hypercholesterolemia, unspecified: Secondary | ICD-10-CM

## 2013-10-19 DIAGNOSIS — I5189 Other ill-defined heart diseases: Secondary | ICD-10-CM | POA: Insufficient documentation

## 2013-10-19 DIAGNOSIS — R002 Palpitations: Secondary | ICD-10-CM

## 2013-10-19 DIAGNOSIS — I519 Heart disease, unspecified: Secondary | ICD-10-CM

## 2013-10-19 DIAGNOSIS — G4733 Obstructive sleep apnea (adult) (pediatric): Secondary | ICD-10-CM

## 2013-10-19 DIAGNOSIS — R000 Tachycardia, unspecified: Secondary | ICD-10-CM

## 2013-10-19 MED ORDER — METOPROLOL TARTRATE 25 MG PO TABS: 25.0000 mg | ORAL_TABLET | Freq: Two times a day (BID) | ORAL | Status: DC

## 2013-10-19 NOTE — Progress Notes (Signed)
Patient ID: Brittany Bender, female   DOB: 1950/11/16, 63 y.o.   MRN: [id number]    [address], [place]  [address] Phone: [phone] Fax:  [phone]  Date:  10/19/2013   ID:  Brittany Bender, DOB 02/20/1950, MRN [id number]  PCP:  Elby Showers, MD   ASSESSMENT:  1. Fast heart rate, sinus tachycardia, likely secondary to hyperadrenergic state from sleep apnea and physical deconditioning 2. Diastolic dysfunction by echo with normal systolic function 3. Sleep apnea, moderate do to obstructive physiology  PLAN:  1. No evidence of ischemia or systolic dysfunction were noted by the cardiac evaluation which included a treadmill test and echo. I therefore have recommended low-dose beta blocker therapy to slow her heart rate and to help with diastolic function. 2. Treatment with sleep apnea per Dr. Barnabas Lister 3. Aerobic exercise is safe and will help to improve physical deconditioning 4. As conditioning improves and sleep apnea is treated, beta blocker therapy will likely not be necessary. 5. I will see on an as-needed basis as we have not identified a significant cardiac issue.   SUBJECTIVE: Brittany Bender is a 62 y.o. female who has no complaints. She has been diagnosed with sleep apnea. She denies palpitations and chest pain. An exercise treadmill test demonstrated deconditioning but no evidence of ischemia. Echo demonstrated normal systolic function with diastolic relaxation abnormality. She is started CPAP under the guidance of Dr. clamps.   Wt Readings from Last 3 Encounters:  10/19/13 270 lb 12.8 oz (122.834 kg)  10/05/13 270 lb (122.471 kg)  09/17/13 265 lb (120.203 kg)     Past Medical History  Diagnosis Date  . PONV (postoperative nausea and vomiting)   . Asthma     last SOB was back in the summer, Never had "attack"  . Cancer     right breast cancer 2000-had radiation  . Arthritis     all over  . GERD (gastroesophageal reflux disease)     Current  Outpatient Prescriptions  Medication Sig Dispense Refill  . albuterol (PROVENTIL HFA;VENTOLIN HFA) 108 (90 BASE) MCG/ACT inhaler Inhale 2 puffs into the lungs every 6 (six) hours as needed. For shortness of breath.       [name] BIOTIN PO Take 1 tablet by mouth daily.        . budesonide-formoterol (SYMBICORT) 80-4.5 MCG/ACT inhaler Inhale 2 puffs into the lungs as needed.      . Cholecalciferol (VITAMIN D-3 PO) Take 2 capsules by mouth daily.        . Cyanocobalamin (VITAMIN B12 PO) Take 2 tablets by mouth daily.        . diclofenac (VOLTAREN) 75 MG EC tablet Take 75 mg by mouth as needed.       . fish oil-omega-3 fatty acids 1000 MG capsule Take 1 g by mouth 2 (two) times daily.        [name] levocetirizine (XYZAL) 5 MG tablet Take 5 mg by mouth every evening.      . Linoleic Acid Conjugated (CLA PO) Take 1 tablet by mouth daily.        . metoprolol tartrate (LOPRESSOR) 25 MG tablet Take 25 mg by mouth daily.      . montelukast (SINGULAIR) 10 MG tablet Take 10 mg by mouth as needed.       . Multiple Vitamins-Minerals (MULTIVITAMINS THER. W/MINERALS) TABS Take 2 tablets by mouth daily.        [name] OVER THE COUNTER MEDICATION  Take 1 tablet by mouth as needed. Quick digest      . polyethylene glycol (MIRALAX / GLYCOLAX) packet Take 17 g by mouth 2 (two) times daily as needed. For constipation       . PRESCRIPTION MEDICATION Take 1 tablet by mouth every 6 (six) hours as needed. Robaxin-For muscle spasm.       No current facility-administered medications for this visit.    Allergies:   No Known Allergies  Social History:  The patient  reports that she has never smoked. She does not have any smokeless tobacco history on file. She reports that she drinks alcohol.   ROS:  Please see the history of present illness.   No edema. Denies syncope. No prolonged palpitations   All other systems reviewed and negative.   OBJECTIVE: VS:  BP 132/84  Pulse 99  Ht 5\' 6"  (1.676 m)  Wt 270 lb 12.8 oz (122.834 kg)  BMI  43.73 kg/m2  SpO2 96% Well nourished, well developed, in no acute distress, obese HEENT: normal Neck: JVD flat. Carotid bruit absent  Cardiac:  normal S1, S2; RRR; no murmur Lungs:  clear to auscultation bilaterally, no wheezing, rhonchi or rales Abd: soft, nontender, no hepatomegaly Ext: Edema absent. Pulses 2+ Skin: warm and dry Neuro:  CNs 2-12 intact, no focal abnormalities noted  EKG:  Not repeated       Signed, Illene Labrador III, MD 10/19/2013 8:59 AM

## 2013-10-19 NOTE — Patient Instructions (Signed)
Your physician has recommended you make the following change in your medication:  1) INCREASE Metoprolol to 25mg  twice daily.  Your physician recommends that you schedule a follow-up appointment as needed

## 2013-10-25 ENCOUNTER — Other Ambulatory Visit: Payer: BC Managed Care – PPO | Admitting: Internal Medicine

## 2013-10-25 ENCOUNTER — Other Ambulatory Visit: Payer: Self-pay | Admitting: Internal Medicine

## 2013-10-25 DIAGNOSIS — E559 Vitamin D deficiency, unspecified: Secondary | ICD-10-CM

## 2013-10-25 DIAGNOSIS — E7800 Pure hypercholesterolemia, unspecified: Secondary | ICD-10-CM

## 2013-10-25 DIAGNOSIS — Z1329 Encounter for screening for other suspected endocrine disorder: Secondary | ICD-10-CM

## 2013-10-25 DIAGNOSIS — Z130 Encounter for screening for diseases of the blood and blood-forming organs and certain disorders involving the immune mechanism: Secondary | ICD-10-CM

## 2013-10-25 DIAGNOSIS — Z0000 Encounter for general adult medical examination without abnormal findings: Secondary | ICD-10-CM

## 2013-10-26 ENCOUNTER — Ambulatory Visit (INDEPENDENT_AMBULATORY_CARE_PROVIDER_SITE_OTHER): Payer: BC Managed Care – PPO | Admitting: Internal Medicine

## 2013-10-26 ENCOUNTER — Encounter: Payer: Self-pay | Admitting: Internal Medicine

## 2013-10-26 VITALS — BP 122/80 | HR 72 | Temp 98.8°F | Resp 16 | Ht 64.5 in | Wt 265.0 lb

## 2013-10-26 DIAGNOSIS — M545 Low back pain, unspecified: Secondary | ICD-10-CM

## 2013-10-26 DIAGNOSIS — Z0000 Encounter for general adult medical examination without abnormal findings: Secondary | ICD-10-CM

## 2013-10-26 DIAGNOSIS — Z8709 Personal history of other diseases of the respiratory system: Secondary | ICD-10-CM

## 2013-10-26 DIAGNOSIS — G4733 Obstructive sleep apnea (adult) (pediatric): Secondary | ICD-10-CM

## 2013-10-26 DIAGNOSIS — Z853 Personal history of malignant neoplasm of breast: Secondary | ICD-10-CM

## 2013-10-26 DIAGNOSIS — I5189 Other ill-defined heart diseases: Secondary | ICD-10-CM

## 2013-10-26 DIAGNOSIS — Z23 Encounter for immunization: Secondary | ICD-10-CM

## 2013-10-26 DIAGNOSIS — M5090 Cervical disc disorder, unspecified, unspecified cervical region: Secondary | ICD-10-CM

## 2013-10-26 DIAGNOSIS — E669 Obesity, unspecified: Secondary | ICD-10-CM

## 2013-10-26 DIAGNOSIS — I519 Heart disease, unspecified: Secondary | ICD-10-CM

## 2013-10-26 LAB — CBC WITH DIFFERENTIAL/PLATELET
Basophils Absolute: 0 10*3/uL (ref 0.0–0.1)
Basophils Relative: 1 % (ref 0–1)
Eosinophils Absolute: 0.1 10*3/uL (ref 0.0–0.7)
Eosinophils Relative: 3 % (ref 0–5)
HEMATOCRIT: 35.2 % — AB (ref 36.0–46.0)
Hemoglobin: 11.7 g/dL — ABNORMAL LOW (ref 12.0–15.0)
LYMPHS PCT: 37 % (ref 12–46)
Lymphs Abs: 1.4 10*3/uL (ref 0.7–4.0)
MCH: 29.5 pg (ref 26.0–34.0)
MCHC: 33.2 g/dL (ref 30.0–36.0)
MCV: 88.9 fL (ref 78.0–100.0)
MONO ABS: 0.3 10*3/uL (ref 0.1–1.0)
Monocytes Relative: 9 % (ref 3–12)
Neutro Abs: 1.9 10*3/uL (ref 1.7–7.7)
Neutrophils Relative %: 50 % (ref 43–77)
Platelets: 266 10*3/uL (ref 150–400)
RBC: 3.96 MIL/uL (ref 3.87–5.11)
RDW: 16.2 % — ABNORMAL HIGH (ref 11.5–15.5)
WBC: 3.8 10*3/uL — AB (ref 4.0–10.5)

## 2013-10-26 LAB — LIPID PANEL
Cholesterol: 205 mg/dL — ABNORMAL HIGH (ref 0–200)
HDL: 68 mg/dL (ref >39)
LDL Cholesterol: 120 mg/dL — ABNORMAL HIGH (ref 0–99)
Total CHOL/HDL Ratio: 3 Ratio
Triglycerides: 84 mg/dL (ref <150)
VLDL: 17 mg/dL (ref 0–40)

## 2013-10-26 LAB — COMPREHENSIVE METABOLIC PANEL
ALBUMIN: 4.1 g/dL (ref 3.5–5.2)
AST: 13 U/L (ref 0–37)
Alkaline Phosphatase: 72 U/L (ref 39–117)
BUN: 8 mg/dL (ref 6–23)
CALCIUM: 9.3 mg/dL (ref 8.4–10.5)
CHLORIDE: 105 meq/L (ref 96–112)
CO2: 26 mEq/L (ref 19–32)
Creat: 0.81 mg/dL (ref 0.50–1.10)
Glucose, Bld: 92 mg/dL (ref 70–99)
POTASSIUM: 4.2 meq/L (ref 3.5–5.3)
Sodium: 141 mEq/L (ref 135–145)
Total Bilirubin: 0.3 mg/dL (ref 0.2–1.2)
Total Protein: 7.3 g/dL (ref 6.0–8.3)

## 2013-10-26 LAB — VITAMIN D 25 HYDROXY (VIT D DEFICIENCY, FRACTURES): Vit D, 25-Hydroxy: 29 ng/mL — ABNORMAL LOW (ref 30–89)

## 2013-10-26 LAB — POCT URINALYSIS DIPSTICK
Bilirubin, UA: NEGATIVE
Blood, UA: NEGATIVE
Glucose, UA: NEGATIVE
Ketones, UA: NEGATIVE
Leukocytes, UA: NEGATIVE
Nitrite, UA: POSITIVE
PROTEIN UA: NEGATIVE
SPEC GRAV UA: 1.01
Urobilinogen, UA: NEGATIVE
pH, UA: 6

## 2013-10-26 LAB — HEMOGLOBIN A1C
HEMOGLOBIN A1C: 6 % — AB (ref <5.7)
Mean Plasma Glucose: 126 mg/dL — ABNORMAL HIGH (ref <117)

## 2013-10-26 LAB — TSH: TSH: 2.038 u[IU]/mL (ref 0.350–4.500)

## 2013-10-26 MED ORDER — ALPRAZOLAM 0.5 MG PO TABS: 0.5000 mg | ORAL_TABLET | Freq: Every evening | ORAL | Status: DC | PRN

## 2013-12-03 ENCOUNTER — Ambulatory Visit: Payer: BC Managed Care – PPO | Admitting: Pulmonary Disease

## 2013-12-16 ENCOUNTER — Other Ambulatory Visit: Payer: Self-pay | Admitting: Neurosurgery

## 2013-12-21 ENCOUNTER — Encounter (HOSPITAL_COMMUNITY): Payer: Self-pay

## 2013-12-21 ENCOUNTER — Encounter (HOSPITAL_COMMUNITY)
Admission: RE | Admit: 2013-12-21 | Discharge: 2013-12-21 | Disposition: A | Discharge status: Home / Self Care | Payer: BC Managed Care – PPO | Source: Ambulatory Visit | Attending: Neurosurgery | Admitting: Neurosurgery

## 2013-12-21 DIAGNOSIS — F419 Anxiety disorder, unspecified: Secondary | ICD-10-CM | POA: Diagnosis not present

## 2013-12-21 DIAGNOSIS — K219 Gastro-esophageal reflux disease without esophagitis: Secondary | ICD-10-CM | POA: Diagnosis not present

## 2013-12-21 DIAGNOSIS — E559 Vitamin D deficiency, unspecified: Secondary | ICD-10-CM | POA: Diagnosis not present

## 2013-12-21 DIAGNOSIS — Z6841 Body Mass Index (BMI) 40.0 and over, adult: Secondary | ICD-10-CM | POA: Diagnosis not present

## 2013-12-21 DIAGNOSIS — M47892 Other spondylosis, cervical region: Secondary | ICD-10-CM | POA: Diagnosis not present

## 2013-12-21 DIAGNOSIS — M5012 Cervical disc disorder with radiculopathy, mid-cervical region: Secondary | ICD-10-CM | POA: Diagnosis present

## 2013-12-21 DIAGNOSIS — Z853 Personal history of malignant neoplasm of breast: Secondary | ICD-10-CM | POA: Diagnosis not present

## 2013-12-21 DIAGNOSIS — I10 Essential (primary) hypertension: Secondary | ICD-10-CM | POA: Diagnosis not present

## 2013-12-21 DIAGNOSIS — E669 Obesity, unspecified: Secondary | ICD-10-CM | POA: Diagnosis not present

## 2013-12-21 DIAGNOSIS — G4733 Obstructive sleep apnea (adult) (pediatric): Secondary | ICD-10-CM | POA: Diagnosis not present

## 2013-12-21 DIAGNOSIS — J45909 Unspecified asthma, uncomplicated: Secondary | ICD-10-CM | POA: Diagnosis not present

## 2013-12-21 HISTORY — DX: Sleep apnea, unspecified: G47.30

## 2013-12-21 HISTORY — DX: Anxiety disorder, unspecified: F41.9

## 2013-12-21 HISTORY — DX: Essential (primary) hypertension: I10

## 2013-12-21 LAB — CBC
HCT: 35.2 % — ABNORMAL LOW (ref 36.0–46.0)
HEMOGLOBIN: 11.3 g/dL — AB (ref 12.0–15.0)
MCH: 30 pg (ref 26.0–34.0)
MCHC: 32.1 g/dL (ref 30.0–36.0)
MCV: 93.4 fL (ref 78.0–100.0)
Platelets: 231 10*3/uL (ref 150–400)
RBC: 3.77 MIL/uL — AB (ref 3.87–5.11)
RDW: 14.7 % (ref 11.5–15.5)
WBC: 4.9 10*3/uL (ref 4.0–10.5)

## 2013-12-21 LAB — BASIC METABOLIC PANEL
Anion gap: 14 (ref 5–15)
BUN: 10 mg/dL (ref 6–23)
CHLORIDE: 105 meq/L (ref 96–112)
CO2: 22 meq/L (ref 19–32)
Calcium: 9.5 mg/dL (ref 8.4–10.5)
Creatinine, Ser: 0.73 mg/dL (ref 0.50–1.10)
GFR calc Af Amer: >90 mL/min (ref >90)
GFR calc non Af Amer: >90 mL/min (ref >90)
GLUCOSE: 94 mg/dL (ref 70–99)
POTASSIUM: 3.7 meq/L (ref 3.7–5.3)
SODIUM: 141 meq/L (ref 137–147)

## 2013-12-21 LAB — SURGICAL PCR SCREEN
MRSA, PCR: NEGATIVE
Staphylococcus aureus: NEGATIVE

## 2013-12-21 NOTE — Pre-Procedure Instructions (Signed)
Brittany Bender  12/21/2013   Your procedure is scheduled on:  12/27/2013  Report to [facility] Admitting   ENTRANCE A at 5:30 AM.  Call this number if you have problems the morning of surgery: [phone]   Remember:   Do not eat food or drink liquids after midnight.  On SUNDAY   Take these medicines the morning of surgery with A SIP OF WATER:  Use inhalers as needed, methocarbamol in needed, METOPROLOL,  Antacid in needed    Do not wear jewelry, make-up or nail polish.   Do not wear lotions, powders, or perfumes. You may wear deodorant.   Do not shave 48 hours prior to surgery.    Do not bring valuables to the hospital.  [facility] is not responsible                  for any belongings or valuables.               Contacts, dentures or bridgework may not be worn into surgery.   Leave suitcase in the car. After surgery it may be brought to your room.   For patients admitted to the hospital, discharge time is determined by your                treatment team.               Patients discharged the day of surgery will not be allowed to drive  home.  Name and phone number of your driver: with friend   Special Instructions: Special Instructions: [facility] - Preparing for Surgery  Before surgery, you can play an important role.  Because skin is not sterile, your skin needs to be as free of germs as possible.  You can reduce the number of germs on you skin by washing with CHG (chlorahexidine gluconate) soap before surgery.  CHG is an antiseptic cleaner which kills germs and bonds with the skin to continue killing germs even after washing.  Please DO NOT use if you have an allergy to CHG or antibacterial soaps.  If your skin becomes reddened/irritated stop using the CHG and inform your nurse when you arrive at Short Stay.  Do not shave (including legs and underarms) for at least 48 hours prior to the first CHG shower.  You may shave your face.  Please follow these  instructions carefully:   1.  Shower with CHG Soap the night before surgery and the  morning of Surgery.  2.  If you choose to wash your hair, wash your hair first as usual with your  normal shampoo.  3.  After you shampoo, rinse your hair and body thoroughly to remove the  Shampoo.  4.  Use CHG as you would any other liquid soap.  You can apply chg directly to the skin and wash gently with scrungie or a clean washcloth.  5.  Apply the CHG Soap to your body ONLY FROM THE NECK DOWN.    Do not use on open wounds or open sores.  Avoid contact with your eyes, ears, mouth and genitals (private parts).  Wash genitals (private parts)   with your normal soap.  6.  Wash thoroughly, paying special attention to the area where your surgery will be performed.  7.  Thoroughly rinse your body with warm water from the neck down.  8.  DO NOT shower/wash with your normal soap after using and rinsing off   the CHG Soap.  9.  Pat yourself dry with a clean towel.            10.  Wear clean pajamas.            11.  Place clean sheets on your bed the night of your first shower and do not sleep with pets.  Day of Surgery  Do not apply any lotions/deodorants the morning of surgery.  Please wear clean clothes to the hospital/surgery center.   Please read over the following fact sheets that you were given: Pain Booklet, Coughing and Deep Breathing, MRSA Information and Surgical Site Infection Prevention

## 2013-12-24 NOTE — Patient Instructions (Signed)
Continue diet and exercise and weight loss efforts. Return in 6 months.

## 2013-12-24 NOTE — Progress Notes (Signed)
Subjective:    Patient ID: Brittany Bender, female    DOB: [birth year], 63 y.o.   MRN: [id number]  HPI  63 year old Black Female for health maintenance exam and evaluation of medical issues.  History of right breast cancer status post lumpectomy by Dr. Lennie Hummer in 2000  History of hysterectomy and bilateral oophorectomy by Dr. Gertie Fey in 2004  Surgery for herniated disc L4-L5 by Dr. 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 She resides in [place]. Commutes 5 days a week. She is divorced. Has a college education. Does not smoke. Drinks beer wine and liquor but not to excess. No children.  Family history: Father died of an asthma attack. Mother died of natural causes with history of dementia. One brother died accidentally as a car fell on him. One sister killed in a motor vehicle accident by drunk driver. 6 brothers living. One sister living with history of MI and smoking. 2 brothers living with history of coronary artery disease status post CABG with history of smoking.  History of colonoscopy by Dr. Erskine Emery showing diverticulosis March 2008.  Issues with tachycardia. Diagnosed with sleep apnea by Dr. Gwenette Greet. Seen by Dr. Pernell Dupre. 24-hour Holter monitor showed pulse of the 100 up to 148 bpm. Stress test negative. 2-D echocardiogram showed normal ejection fraction but diastolic dysfunction.    Review of Systems  Constitutional: Negative.   All other systems reviewed and are negative.      Objective:   Physical Exam  Constitutional: She is oriented to person, place, and time. She appears well-developed and well-nourished. No distress.  HENT:  Head: Normocephalic and atraumatic.  Right Ear: External ear normal.  Left Ear: External ear normal.  Mouth/Throat: Oropharynx is clear and moist. No  oropharyngeal exudate.  Eyes: Conjunctivae and EOM are normal. Pupils are equal, round, and reactive to light. Right eye exhibits no discharge. Left eye exhibits no discharge. No scleral icterus.  Neck: Neck supple. No JVD present. No thyromegaly present.  Cardiovascular: Normal rate, regular rhythm, normal heart sounds and intact distal pulses.   No murmur heard. Pulmonary/Chest: Effort normal and breath sounds normal. No respiratory distress. She has no wheezes. She has no rales. She exhibits no tenderness.  Breasts normal female  Abdominal: Soft. Bowel sounds are normal. She exhibits no distension and no mass. There is no tenderness. There is no rebound and no guarding.  Genitourinary:  Deferred to GYN  Musculoskeletal: Normal range of motion. She exhibits no edema.  Lymphadenopathy:    She has no cervical adenopathy.  Neurological: She is alert and oriented to person, place, and time. She has normal reflexes. Coordination normal.  Skin: Skin is warm and dry. No rash noted. She is not diaphoretic. No erythema. No pallor.  Psychiatric: She has a normal mood and affect. Her behavior is normal. Judgment and thought content normal.  Vitals reviewed.         Assessment & Plan:  Obesity-needs to continue with diet exercise efforts  History of asthma  History of allergic rhinitis  History of breast cancer  History of herniated disc L4-L5 status post surgery. Takes Flexeril from time to time for back pain.  Sleep apnea-seen by Dr. Gwenette Greet  Tachycardia-seen by Dr. Pernell Dupre who thought she had physical deconditioning and sleep apnea causing tachycardia.  History of diastolic dysfunction by  echo  History of herniated disc C4-C5 to have surgery by Dr. Hal Neer . Has left arm radiculopathy.

## 2013-12-26 MED ORDER — CEFAZOLIN SODIUM 10 G IJ SOLR
3.0000 g | INTRAMUSCULAR | Status: AC
  Administered 2013-12-27: 3 g via INTRAVENOUS
  Filled 2013-12-26: qty 3000

## 2013-12-26 MED ORDER — DEXAMETHASONE SODIUM PHOSPHATE 10 MG/ML IJ SOLN
10.0000 mg | INTRAMUSCULAR | Status: AC
  Administered 2013-12-27: 10 mg via INTRAVENOUS

## 2013-12-27 ENCOUNTER — Encounter (HOSPITAL_COMMUNITY): Payer: Self-pay | Admitting: *Deleted

## 2013-12-27 ENCOUNTER — Ambulatory Visit (HOSPITAL_COMMUNITY): Payer: BC Managed Care – PPO | Admitting: Certified Registered"

## 2013-12-27 ENCOUNTER — Ambulatory Visit (HOSPITAL_COMMUNITY): Payer: BC Managed Care – PPO

## 2013-12-27 ENCOUNTER — Ambulatory Visit (HOSPITAL_COMMUNITY)
Admission: RE | Admit: 2013-12-27 | Discharge: 2013-12-28 | Disposition: A | Discharge status: Home / Self Care | Payer: BC Managed Care – PPO | Source: Ambulatory Visit | Attending: Neurosurgery | Admitting: Neurosurgery

## 2013-12-27 ENCOUNTER — Encounter (HOSPITAL_COMMUNITY)
Admission: RE | Disposition: A | Discharge status: Home / Self Care | Payer: Self-pay | Source: Ambulatory Visit | Attending: Neurosurgery

## 2013-12-27 DIAGNOSIS — E669 Obesity, unspecified: Secondary | ICD-10-CM | POA: Insufficient documentation

## 2013-12-27 DIAGNOSIS — E559 Vitamin D deficiency, unspecified: Secondary | ICD-10-CM | POA: Insufficient documentation

## 2013-12-27 DIAGNOSIS — M4802 Spinal stenosis, cervical region: Secondary | ICD-10-CM

## 2013-12-27 DIAGNOSIS — G4733 Obstructive sleep apnea (adult) (pediatric): Secondary | ICD-10-CM | POA: Insufficient documentation

## 2013-12-27 DIAGNOSIS — M47812 Spondylosis without myelopathy or radiculopathy, cervical region: Secondary | ICD-10-CM | POA: Diagnosis present

## 2013-12-27 DIAGNOSIS — M5012 Cervical disc disorder with radiculopathy, mid-cervical region: Secondary | ICD-10-CM | POA: Diagnosis not present

## 2013-12-27 DIAGNOSIS — J45909 Unspecified asthma, uncomplicated: Secondary | ICD-10-CM | POA: Insufficient documentation

## 2013-12-27 DIAGNOSIS — I10 Essential (primary) hypertension: Secondary | ICD-10-CM | POA: Insufficient documentation

## 2013-12-27 DIAGNOSIS — Z853 Personal history of malignant neoplasm of breast: Secondary | ICD-10-CM | POA: Insufficient documentation

## 2013-12-27 DIAGNOSIS — M47892 Other spondylosis, cervical region: Secondary | ICD-10-CM | POA: Insufficient documentation

## 2013-12-27 DIAGNOSIS — F419 Anxiety disorder, unspecified: Secondary | ICD-10-CM | POA: Insufficient documentation

## 2013-12-27 DIAGNOSIS — K219 Gastro-esophageal reflux disease without esophagitis: Secondary | ICD-10-CM | POA: Insufficient documentation

## 2013-12-27 DIAGNOSIS — Z6841 Body Mass Index (BMI) 40.0 and over, adult: Secondary | ICD-10-CM | POA: Insufficient documentation

## 2013-12-27 HISTORY — PX: ANTERIOR CERVICAL DECOMP/DISCECTOMY FUSION: SHX1161

## 2013-12-27 SURGERY — ANTERIOR CERVICAL DECOMPRESSION/DISCECTOMY FUSION 2 LEVELS
Anesthesia: General

## 2013-12-27 MED ORDER — DEXAMETHASONE SODIUM PHOSPHATE 10 MG/ML IJ SOLN
INTRAMUSCULAR | Status: AC
  Filled 2013-12-27: qty 1

## 2013-12-27 MED ORDER — HYDROCODONE-ACETAMINOPHEN 5-325 MG PO TABS
1.0000 | ORAL_TABLET | ORAL | Status: DC | PRN
  Administered 2013-12-27 (×3): 2 via ORAL
  Filled 2013-12-27 (×3): qty 2

## 2013-12-27 MED ORDER — ONDANSETRON HCL 4 MG/2ML IJ SOLN: 4.0000 mg | INTRAMUSCULAR | Status: DC | PRN

## 2013-12-27 MED ORDER — PHENYLEPHRINE HCL 10 MG/ML IJ SOLN
10.0000 mg | INTRAVENOUS | Status: DC | PRN
  Administered 2013-12-27: 40 ug/min via INTRAVENOUS

## 2013-12-27 MED ORDER — MIDAZOLAM HCL 5 MG/5ML IJ SOLN
INTRAMUSCULAR | Status: DC | PRN
  Administered 2013-12-27: 2 mg via INTRAVENOUS

## 2013-12-27 MED ORDER — ACETAMINOPHEN 650 MG RE SUPP: 650.0000 mg | RECTAL | Status: DC | PRN

## 2013-12-27 MED ORDER — METOPROLOL TARTRATE 25 MG PO TABS
25.0000 mg | ORAL_TABLET | Freq: Two times a day (BID) | ORAL | Status: DC
  Administered 2013-12-27: 25 mg via ORAL
  Filled 2013-12-27 (×3): qty 1

## 2013-12-27 MED ORDER — PANTOPRAZOLE SODIUM 40 MG PO TBEC
40.0000 mg | DELAYED_RELEASE_TABLET | Freq: Every day | ORAL | Status: DC
  Administered 2013-12-27: 40 mg via ORAL
  Filled 2013-12-27: qty 1

## 2013-12-27 MED ORDER — GLYCOPYRROLATE 0.2 MG/ML IJ SOLN
INTRAMUSCULAR | Status: DC | PRN
  Administered 2013-12-27: 0.6 mg via INTRAVENOUS

## 2013-12-27 MED ORDER — SCOPOLAMINE 1 MG/3DAYS TD PT72
MEDICATED_PATCH | TRANSDERMAL | Status: DC | PRN
  Administered 2013-12-27: 1 via TRANSDERMAL

## 2013-12-27 MED ORDER — MENTHOL 3 MG MT LOZG
1.0000 | LOZENGE | OROMUCOSAL | Status: DC | PRN
  Filled 2013-12-27: qty 9

## 2013-12-27 MED ORDER — DEXAMETHASONE SODIUM PHOSPHATE 4 MG/ML IJ SOLN: 4.0000 mg | Freq: Four times a day (QID) | INTRAMUSCULAR | Status: AC

## 2013-12-27 MED ORDER — PROPOFOL 10 MG/ML IV BOLUS
INTRAVENOUS | Status: AC
  Filled 2013-12-27: qty 20

## 2013-12-27 MED ORDER — OXYCODONE HCL 5 MG/5ML PO SOLN: 5.0000 mg | Freq: Once | ORAL | Status: DC | PRN

## 2013-12-27 MED ORDER — ZOLPIDEM TARTRATE 5 MG PO TABS: 5.0000 mg | ORAL_TABLET | Freq: Every evening | ORAL | Status: DC | PRN

## 2013-12-27 MED ORDER — PANTOPRAZOLE SODIUM 40 MG IV SOLR
40.0000 mg | Freq: Every day | INTRAVENOUS | Status: DC
  Filled 2013-12-27: qty 40

## 2013-12-27 MED ORDER — OXYCODONE HCL 5 MG PO TABS: 5.0000 mg | ORAL_TABLET | Freq: Once | ORAL | Status: DC | PRN

## 2013-12-27 MED ORDER — HYDROMORPHONE HCL 1 MG/ML IJ SOLN: 1.0000 mg | INTRAMUSCULAR | Status: DC | PRN

## 2013-12-27 MED ORDER — ONDANSETRON HCL 4 MG/2ML IJ SOLN
4.0000 mg | Freq: Four times a day (QID) | INTRAMUSCULAR | Status: DC | PRN
Start: 2013-12-27 — End: 2013-12-27

## 2013-12-27 MED ORDER — METHOCARBAMOL 1000 MG/10ML IJ SOLN
500.0000 mg | Freq: Four times a day (QID) | INTRAVENOUS | Status: DC | PRN
  Filled 2013-12-27: qty 5

## 2013-12-27 MED ORDER — ROCURONIUM BROMIDE 100 MG/10ML IV SOLN
INTRAVENOUS | Status: DC | PRN
  Administered 2013-12-27: 50 mg via INTRAVENOUS

## 2013-12-27 MED ORDER — NEOSTIGMINE METHYLSULFATE 10 MG/10ML IV SOLN
INTRAVENOUS | Status: DC | PRN
  Administered 2013-12-27: 4 mg via INTRAVENOUS

## 2013-12-27 MED ORDER — ALPRAZOLAM 0.5 MG PO TABS: 0.5000 mg | ORAL_TABLET | Freq: Every evening | ORAL | Status: DC | PRN

## 2013-12-27 MED ORDER — SODIUM CHLORIDE 0.9 % IJ SOLN: 3.0000 mL | INTRAMUSCULAR | Status: DC | PRN

## 2013-12-27 MED ORDER — GLYCOPYRROLATE 0.2 MG/ML IJ SOLN
INTRAMUSCULAR | Status: AC
Start: 2013-12-27 — End: 2013-12-27
  Filled 2013-12-27: qty 3

## 2013-12-27 MED ORDER — ARTIFICIAL TEARS OP OINT
TOPICAL_OINTMENT | OPHTHALMIC | Status: AC
  Filled 2013-12-27: qty 3.5

## 2013-12-27 MED ORDER — NEOSTIGMINE METHYLSULFATE 10 MG/10ML IV SOLN
INTRAVENOUS | Status: AC
  Filled 2013-12-27: qty 1

## 2013-12-27 MED ORDER — FENTANYL CITRATE 0.05 MG/ML IJ SOLN
INTRAMUSCULAR | Status: DC | PRN
  Administered 2013-12-27 (×4): 50 ug via INTRAVENOUS

## 2013-12-27 MED ORDER — PROPOFOL 10 MG/ML IV BOLUS
INTRAVENOUS | Status: DC | PRN
  Administered 2013-12-27: 30 mg via INTRAVENOUS
  Administered 2013-12-27: 160 mg via INTRAVENOUS

## 2013-12-27 MED ORDER — BUDESONIDE-FORMOTEROL FUMARATE 80-4.5 MCG/ACT IN AERO
2.0000 | INHALATION_SPRAY | RESPIRATORY_TRACT | Status: DC | PRN
  Filled 2013-12-27: qty 6.9

## 2013-12-27 MED ORDER — ALBUTEROL SULFATE HFA 108 (90 BASE) MCG/ACT IN AERS
INHALATION_SPRAY | RESPIRATORY_TRACT | Status: DC | PRN
  Administered 2013-12-27 (×2): 4 via RESPIRATORY_TRACT

## 2013-12-27 MED ORDER — THROMBIN 5000 UNITS EX SOLR
CUTANEOUS | Status: DC | PRN
  Administered 2013-12-27 (×2): 5000 [IU] via TOPICAL

## 2013-12-27 MED ORDER — KCL IN DEXTROSE-NACL 20-5-0.45 MEQ/L-%-% IV SOLN
80.0000 mL/h | INTRAVENOUS | Status: DC
  Filled 2013-12-27 (×3): qty 1000

## 2013-12-27 MED ORDER — LIDOCAINE HCL (CARDIAC) 20 MG/ML IV SOLN
INTRAVENOUS | Status: AC
  Filled 2013-12-27: qty 5

## 2013-12-27 MED ORDER — SODIUM CHLORIDE 0.9 % IR SOLN
Status: DC | PRN
  Administered 2013-12-27: 500 mL

## 2013-12-27 MED ORDER — KETOROLAC TROMETHAMINE 30 MG/ML IJ SOLN
INTRAMUSCULAR | Status: AC
  Filled 2013-12-27: qty 1

## 2013-12-27 MED ORDER — ONDANSETRON HCL 4 MG/2ML IJ SOLN
INTRAMUSCULAR | Status: DC | PRN
  Administered 2013-12-27: 4 mg via INTRAVENOUS

## 2013-12-27 MED ORDER — METHOCARBAMOL 500 MG PO TABS
500.0000 mg | ORAL_TABLET | Freq: Four times a day (QID) | ORAL | Status: DC | PRN
  Administered 2013-12-27 (×2): 500 mg via ORAL
  Filled 2013-12-27 (×2): qty 1

## 2013-12-27 MED ORDER — MIDAZOLAM HCL 2 MG/2ML IJ SOLN
INTRAMUSCULAR | Status: AC
  Filled 2013-12-27: qty 2

## 2013-12-27 MED ORDER — DOCUSATE SODIUM 100 MG PO CAPS
100.0000 mg | ORAL_CAPSULE | Freq: Two times a day (BID) | ORAL | Status: DC
Start: 2013-12-27 — End: 2013-12-28
  Administered 2013-12-27: 100 mg via ORAL
  Filled 2013-12-27 (×3): qty 1

## 2013-12-27 MED ORDER — HYDROMORPHONE HCL 1 MG/ML IJ SOLN: 0.2500 mg | INTRAMUSCULAR | Status: DC | PRN

## 2013-12-27 MED ORDER — FENTANYL CITRATE 0.05 MG/ML IJ SOLN
INTRAMUSCULAR | Status: AC
  Filled 2013-12-27: qty 5

## 2013-12-27 MED ORDER — SODIUM CHLORIDE 0.9 % IJ SOLN
3.0000 mL | Freq: Two times a day (BID) | INTRAMUSCULAR | Status: DC
  Administered 2013-12-27: 3 mL via INTRAVENOUS

## 2013-12-27 MED ORDER — HEMOSTATIC AGENTS (NO CHARGE) OPTIME
TOPICAL | Status: DC | PRN
  Administered 2013-12-27: 1 via TOPICAL

## 2013-12-27 MED ORDER — PHENYLEPHRINE 40 MCG/ML (10ML) SYRINGE FOR IV PUSH (FOR BLOOD PRESSURE SUPPORT)
PREFILLED_SYRINGE | INTRAVENOUS | Status: AC
  Filled 2013-12-27: qty 10

## 2013-12-27 MED ORDER — DEXAMETHASONE 4 MG PO TABS
4.0000 mg | ORAL_TABLET | Freq: Four times a day (QID) | ORAL | Status: AC
  Administered 2013-12-27 (×2): 4 mg via ORAL
  Filled 2013-12-27 (×2): qty 1

## 2013-12-27 MED ORDER — ROCURONIUM BROMIDE 50 MG/5ML IV SOLN
INTRAVENOUS | Status: AC
  Filled 2013-12-27: qty 1

## 2013-12-27 MED ORDER — LACTATED RINGERS IV SOLN
INTRAVENOUS | Status: DC | PRN
  Administered 2013-12-27 (×2): via INTRAVENOUS

## 2013-12-27 MED ORDER — 0.9 % SODIUM CHLORIDE (POUR BTL) OPTIME
TOPICAL | Status: DC | PRN
  Administered 2013-12-27: 1000 mL

## 2013-12-27 MED ORDER — PHENOL 1.4 % MT LIQD
1.0000 | OROMUCOSAL | Status: DC | PRN
  Administered 2013-12-27: 1 via OROMUCOSAL
  Filled 2013-12-27: qty 177

## 2013-12-27 MED ORDER — LIDOCAINE HCL (CARDIAC) 20 MG/ML IV SOLN
INTRAVENOUS | Status: DC | PRN
  Administered 2013-12-27: 60 mg via INTRAVENOUS

## 2013-12-27 MED ORDER — ARTIFICIAL TEARS OP OINT
TOPICAL_OINTMENT | OPHTHALMIC | Status: DC | PRN
  Administered 2013-12-27: 1 via OPHTHALMIC

## 2013-12-27 MED ORDER — CEFAZOLIN SODIUM-DEXTROSE 2-3 GM-% IV SOLR
2.0000 g | Freq: Three times a day (TID) | INTRAVENOUS | Status: AC
  Administered 2013-12-27 (×2): 2 g via INTRAVENOUS
  Filled 2013-12-27 (×2): qty 50

## 2013-12-27 MED ORDER — SODIUM CHLORIDE 0.9 % IV SOLN: 250.0000 mL | INTRAVENOUS | Status: DC

## 2013-12-27 MED ORDER — ACETAMINOPHEN 325 MG PO TABS: 650.0000 mg | ORAL_TABLET | ORAL | Status: DC | PRN

## 2013-12-27 SURGICAL SUPPLY — 65 items
APL SKNCLS STERI-STRIP NONHPOA (GAUZE/BANDAGES/DRESSINGS) ×1
BAG DECANTER FOR FLEXI CONT (MISCELLANEOUS) ×2 IMPLANT
BENZOIN TINCTURE PRP APPL 2/3 (GAUZE/BANDAGES/DRESSINGS) ×2 IMPLANT
BIT DRILL TRINICA 2.3MM (BIT) IMPLANT
BRUSH SCRUB EZ PLAIN DRY (MISCELLANEOUS) ×2 IMPLANT
BUR MATCHSTICK NEURO 3.0 LAGG (BURR) ×2 IMPLANT
CANISTER SUCT 3000ML (MISCELLANEOUS) ×2 IMPLANT
CONT SPEC 4OZ CLIKSEAL STRL BL (MISCELLANEOUS) ×2 IMPLANT
CORDS BIPOLAR (ELECTRODE) ×1 IMPLANT
DRAPE C-ARM 42X72 X-RAY (DRAPES) ×4 IMPLANT
DRAPE LAPAROTOMY 100X72 PEDS (DRAPES) ×2 IMPLANT
DRAPE MICROSCOPE LEICA (MISCELLANEOUS) ×2 IMPLANT
DRAPE SURG 17X23 STRL (DRAPES) ×4 IMPLANT
DRILL BIT TRINICA 2.3MM (BIT) ×2
DRSG OPSITE POSTOP 4X6 (GAUZE/BANDAGES/DRESSINGS) ×1 IMPLANT
DRSG TELFA 3X8 NADH (GAUZE/BANDAGES/DRESSINGS) ×2 IMPLANT
DURAPREP 6ML APPLICATOR 50/CS (WOUND CARE) ×2 IMPLANT
ELECT COATED BLADE 2.86 ST (ELECTRODE) ×2 IMPLANT
ELECT REM PT RETURN 9FT ADLT (ELECTROSURGICAL) ×2
ELECTRODE REM PT RTRN 9FT ADLT (ELECTROSURGICAL) ×1 IMPLANT
GAUZE SPONGE 4X4 12PLY STRL (GAUZE/BANDAGES/DRESSINGS) ×2 IMPLANT
GAUZE SPONGE 4X4 16PLY XRAY LF (GAUZE/BANDAGES/DRESSINGS) IMPLANT
GLOVE BIOGEL PI IND STRL 8.5 (GLOVE) IMPLANT
GLOVE BIOGEL PI INDICATOR 8.5 (GLOVE) ×1
GLOVE ECLIPSE 8.0 STRL XLNG CF (GLOVE) ×2 IMPLANT
GLOVE EXAM NITRILE LRG STRL (GLOVE) IMPLANT
GLOVE EXAM NITRILE MD LF STRL (GLOVE) ×1 IMPLANT
GLOVE EXAM NITRILE XL STR (GLOVE) IMPLANT
GLOVE EXAM NITRILE XS STR PU (GLOVE) IMPLANT
GLOVE INDICATOR 7.0 STRL GRN (GLOVE) ×1 IMPLANT
GLOVE SS BIOGEL STRL SZ 6.5 (GLOVE) IMPLANT
GLOVE SS BIOGEL STRL SZ 8 (GLOVE) IMPLANT
GLOVE SUPERSENSE BIOGEL SZ 6.5 (GLOVE) ×3
GLOVE SUPERSENSE BIOGEL SZ 8 (GLOVE) ×1
GOWN STRL REUS W/ TWL LRG LVL3 (GOWN DISPOSABLE) ×1 IMPLANT
GOWN STRL REUS W/ TWL XL LVL3 (GOWN DISPOSABLE) IMPLANT
GOWN STRL REUS W/TWL 2XL LVL3 (GOWN DISPOSABLE) ×2 IMPLANT
GOWN STRL REUS W/TWL LRG LVL3 (GOWN DISPOSABLE) ×2
GOWN STRL REUS W/TWL XL LVL3 (GOWN DISPOSABLE) ×2
HALTER HD/CHIN CERV TRACTION D (MISCELLANEOUS) ×2 IMPLANT
KIT BASIN OR (CUSTOM PROCEDURE TRAY) ×2 IMPLANT
KIT ROOM TURNOVER OR (KITS) ×2 IMPLANT
NDL SPNL 20GX3.5 QUINCKE YW (NEEDLE) ×1 IMPLANT
NEEDLE SPNL 20GX3.5 QUINCKE YW (NEEDLE) ×2 IMPLANT
NS IRRIG 1000ML POUR BTL (IV SOLUTION) ×2 IMPLANT
PACK LAMINECTOMY NEURO (CUSTOM PROCEDURE TRAY) ×2 IMPLANT
PAD ARMBOARD 7.5X6 YLW CONV (MISCELLANEOUS) ×2 IMPLANT
PAD DRESSING TELFA 3X8 NADH (GAUZE/BANDAGES/DRESSINGS) ×1 IMPLANT
PATTIES SURGICAL .75X.75 (GAUZE/BANDAGES/DRESSINGS) ×1 IMPLANT
PLATE 40MM (Plate) ×2 IMPLANT
PUTTY BONE GRAFT KIT 2.5ML (Bone Implant) ×2 IMPLANT
RUBBERBAND STERILE (MISCELLANEOUS) ×4 IMPLANT
SCREW SD FIXED 12MM (Screw) ×1 IMPLANT
SPACER TM-S 11X14X5-7 (Spacer) ×1 IMPLANT
SPACER TMS 11X14X6MM (Spacer) ×1 IMPLANT
SPONGE INTESTINAL PEANUT (DISPOSABLE) ×2 IMPLANT
SPONGE SURGIFOAM ABS GEL SZ50 (HEMOSTASIS) ×2 IMPLANT
STRIP CLOSURE SKIN 1/2X4 (GAUZE/BANDAGES/DRESSINGS) ×2 IMPLANT
SUT PDS AB 5-0 P3 18 (SUTURE) ×2 IMPLANT
SUT VIC AB 3-0 CP2 18 (SUTURE) ×2 IMPLANT
SYR 20ML ECCENTRIC (SYRINGE) IMPLANT
TOWEL OR 17X24 6PK STRL BLUE (TOWEL DISPOSABLE) ×2 IMPLANT
TOWEL OR 17X26 10 PK STRL BLUE (TOWEL DISPOSABLE) ×2 IMPLANT
TRAP SPECIMEN MUCOUS 40CC (MISCELLANEOUS) ×1 IMPLANT
WATER STERILE IRR 1000ML POUR (IV SOLUTION) ×2 IMPLANT

## 2013-12-27 NOTE — Op Note (Signed)
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 Localizing neck was taken prior to incision to identify the appropriate level. Transverse incision was made in the right anterior neck started midline and headed towards the medial aspect of the sternocleidomastoid muscle. The platysma muscle was then incised transversely and the natural fascial plane between the strap muscles medially and the sternal cremaster laterally was identified and followed down to the anterior aspect the cervical spine. The longus Cole muscles were identified and split the midline and stripped Ray bilaterally with unipolar coagulation and Kitner dissection. Self-retaining retractor was placed for exposure and x-ray showed approach the appropriate levels. Using a 15 blade the end of the disc at C4-5 and C5-6 was incised. Using pituitary rongeurs and curettes approximately 90% of the disc material was removed at both levels. High-speed drill was used to widen the interspace and bony shavings were saved for use later in the case. At this time the microscope was draped brought in the field and used for the remainder of the case. Starting at C5-6 the remainder of the disc down the posterior longitudinal ligament was removed. When was then incised transversely and the cut edges removed a Kerrison punch. Large amounts of herniated disc material were identified compressing the spinal cord on the right half of the canal this was removed without difficulty. We then decompressed the foramen bilaterally until the C6 nerve roots were well visualized well decompressed. At this time we turned our attention to C4-5.  We did a similar decompression with generous decompression of the spinal dura and foramen bilaterally until the C5 nerve roots well visualized well decompressed. At this time we took measurements and we chose 15 mm one 6 mm lordotic trabecular metal graft and filled with a mixture of times bone and morselized allograft. We irrigated copiously controlled any bleeding with upper coagulation Gelfoam. We impacted the small graft at C4-5 and the larger graft at C5-6 and fossae showed them to be in good position. An appropriately length Trinica anterior cervical plate was then chosen. Under fluoroscopic guidance drill holes were placed followed by placing of 12 mm screws 6. The locking mechanisms right locked position and final fossae showed good position of the plates screws and plugs. Irrigation was carried out and any bleeding control proper coagulation. The was then closed with inverted Vicryl on the platysma muscle and subcuticular layer. Steri-Strips were placed on the skin. A sterile dressing and soft collar applied and the patient was extubated and taken to recovery in stable condition.

## 2013-12-27 NOTE — Anesthesia Procedure Notes (Signed)
Procedure Name: Intubation Date/Time: 12/27/2013 7:47 AM Performed by: Gaylene Brooks Pre-anesthesia Checklist: Patient identified, Timeout performed, Emergency Drugs available, Suction available and Patient being monitored Patient Re-evaluated:Patient Re-evaluated prior to inductionOxygen Delivery Method: Circle system utilized Preoxygenation: Pre-oxygenation with 100% oxygen Intubation Type: IV induction Ventilation: Mask ventilation without difficulty and Oral airway inserted - appropriate to patient size Laryngoscope Size: Sabra Heck and 2 Grade View: Grade II Tube type: Oral Tube size: 7.5 mm Number of attempts: 1 Airway Equipment and Method: Stylet Placement Confirmation: ETT inserted through vocal cords under direct vision,  breath sounds checked- equal and bilateral,  positive ETCO2 and CO2 detector Secured at: 23 cm Tube secured with: Tape Dental Injury: Teeth and Oropharynx as per pre-operative assessment

## 2013-12-27 NOTE — Transfer of Care (Signed)
Immediate Anesthesia Transfer of Care Note  Patient: Brittany Bender  Procedure(s) Performed: Procedure(s): CERVICAL FOUR TO FIVE, CERVICAL FIVE TO SIX ANTERIOR CERVICAL DECOMPRESSION/DISCECTOMY FUSION 2 LEVELS (N/A)  Patient Location: PACU  Anesthesia Type:General  Level of Consciousness: alert , oriented and sedated  Airway & Oxygen Therapy: Patient Spontanous Breathing and Patient connected to face mask oxygen  Post-op Assessment: Report given to PACU RN, Post -op Vital signs reviewed and stable and Patient moving all extremities X 4  Post vital signs: Reviewed and stable  Complications: No apparent anesthesia complications

## 2013-12-27 NOTE — Anesthesia Preprocedure Evaluation (Addendum)
Anesthesia Evaluation  Patient identified by MRN, date of birth, ID band Patient awake    Reviewed: Allergy & Precautions, H&P , NPO status , Patient's Chart, lab work & pertinent test results  History of Anesthesia Complications (+) PONV  Airway Mallampati: II  TM Distance: >3 FB Neck ROM: full    Dental  (+) Teeth Intact, Dental Advisory Given   Pulmonary asthma , sleep apnea ,          Cardiovascular hypertension,     Neuro/Psych Anxiety    GI/Hepatic GERD-  ,  Endo/Other  Morbid obesity  Renal/GU      Musculoskeletal  (+) Arthritis -,   Abdominal   Peds  Hematology   Anesthesia Other Findings   Reproductive/Obstetrics                            Anesthesia Physical Anesthesia Plan  ASA: II  Anesthesia Plan: General   Post-op Pain Management:    Induction: Intravenous  Airway Management Planned: Oral ETT  Additional Equipment:   Intra-op Plan:   Post-operative Plan: Extubation in OR  Informed Consent: I have reviewed the patients History and Physical, chart, labs and discussed the procedure including the risks, benefits and alternatives for the proposed anesthesia with the patient or authorized representative who has indicated his/her understanding and acceptance.     Plan Discussed with: CRNA, Anesthesiologist and Surgeon  Anesthesia Plan Comments:         Anesthesia Quick Evaluation

## 2013-12-27 NOTE — H&P (Signed)
Brittany Bender is an 63 y.o. female.   Chief Complaint: Left arm pain HPI: The patient is a 63 year old female who is evaluated in the office for neck pain with radiation down the left shoulder and arm some pain by the left scapula. She's had discomfort number of months with no inciting event. She's orthopedic Dr. gave her steroid injection without relief. She tried Celebrex because her blood pressure go up so she stopped the medication. When she went to [place] in July the pain increased dramatically. She can't*chiropractor while down there and continue with that [facility] but this did not give her any improvement. When seen in the office she does not have imaging studies. She underwent MRI scanning which showed significant foraminal encroachment at L4-5 on the left, a central and right-sided disc abnormality at C5-6, and mild disease at C6-7. It was felt that C4-5 and C5-6 were more likely to be symptomatic issues after discussing the options the patient requested surgery now comes for a two-level anterior cervical discectomy with fusion and plating. I've had a long discussion with her regarding the risks and benefits of surgical intervention. The risks discussed include but are not limited to bleeding infection weakness some as paralysis spinal fluid leak coma quadriplegia hoarseness and death. We have discussed alternative methods of therapy along the risks and benefits of nonintervention. She's had the opportunity to ask numerous questions and appears to understand. With this information in hand she has requested that we proceed with surgery.  Past Medical History  Diagnosis Date  . PONV (postoperative nausea and vomiting)   . Asthma     last SOB was back in the summer, Never had "attack"  . Cancer     right breast cancer 2000-had radiation  . GERD (gastroesophageal reflux disease)   . Anxiety     xanax used primarily for sleep   . Arthritis     all over, cervical spine- stenosis   .  Hypertension     prompted by having palpitations to see Dr,. Linard Millers, he only needs to follow up with her on PRN basis  . Sleep apnea     has OSA- uses CPAP but not for last 2 weeks, due to sinus issues     Past Surgical History  Procedure Laterality Date  . Breast surgery      right lumpectomy 2000  . Abdominal hysterectomy      2004  . Foot ganglion excision      right and left at different times  . Foot surgery      3 yrs ago broke left foot had hardware inserted and then had to have it removed  . Lumbar laminectomy/decompression microdiscectomy  12/24/2010    Procedure: LUMBAR LAMINECTOMY/DECOMPRESSION MICRODISCECTOMY;  Surgeon: Otilio Connors;  Location: [facility];  Service: Neurosurgery;  Laterality: Right;  RIGHT LUMBAR FOUR-FIVE LAMINECTOMY/DISCECTOMY  . Back surgery    . Knee arthroscopy Left 2013    Family History  Problem Relation Age of Onset  . Asthma Father   . Heart disease Sister     heart attack x 2  . Heart disease Brother     bypass   Social History:  reports that she has never smoked. She does not have any smokeless tobacco history on file. She reports that she drinks alcohol. She reports that she does not use illicit drugs.  Allergies: No Known Allergies  Medications Prior to Admission  Medication Sig Dispense Refill  . acetaminophen (TYLENOL) 500 MG  tablet Take 1,000 mg by mouth every 12 (twelve) hours as needed for mild pain or moderate pain.    [name] albuterol (PROVENTIL HFA;VENTOLIN HFA) 108 (90 BASE) MCG/ACT inhaler Inhale 2 puffs into the lungs every 6 (six) hours as needed. For shortness of breath.     . ALPRAZolam (XANAX) 0.5 MG tablet Take 1 tablet (0.5 mg total) by mouth at bedtime as needed for anxiety. 60 tablet 3  . ciprofloxacin (CIPRO) 500 MG tablet Take 500 mg by mouth 2 (two) times daily.    . metoprolol tartrate (LOPRESSOR) 25 MG tablet Take 1 tablet (25 mg total) by mouth 2 (two) times daily.    . Triamcinolone Acetonide (NASACORT  ALLERGY 24HR NA) Place 2 sprays into the nose daily as needed (Allergies).    [name] BIOTIN PO Take 1 tablet by mouth daily.      . budesonide-formoterol (SYMBICORT) 80-4.5 MCG/ACT inhaler Inhale 2 puffs into the lungs as needed.    . Cholecalciferol (VITAMIN D-3 PO) Take 2 capsules by mouth daily.      . Cyanocobalamin (VITAMIN B12 PO) Take 2 tablets by mouth daily.      . diclofenac (VOLTAREN) 75 MG EC tablet Take 75 mg by mouth as needed.     . fish oil-omega-3 fatty acids 1000 MG capsule Take 1 g by mouth 2 (two) times daily.      [name] levocetirizine (XYZAL) 5 MG tablet Take 5 mg by mouth daily as needed for allergies.     . Linoleic Acid Conjugated (CLA PO) Take 1 tablet by mouth daily.      . methocarbamol (ROBAXIN) 500 MG tablet Take 500 mg by mouth every 8 (eight) hours as needed for muscle spasms.    . montelukast (SINGULAIR) 10 MG tablet Take 10 mg by mouth as needed.     . Multiple Vitamins-Minerals (MULTIVITAMINS THER. W/MINERALS) TABS Take 2 tablets by mouth daily.      [name] OVER THE COUNTER MEDICATION Take 1 tablet by mouth as needed. Quick digest      No results found for this or any previous visit (from the past 48 hour(s)). No results found.  Positive for asthma regular pulse and the neck issues  Blood pressure 134/66, pulse 85, temperature 97.8 F (36.6 C), temperature source Oral, resp. rate 20, height 5\' 6"  (1.676 m), weight 122.244 kg (269 lb 8 oz), SpO2 99 %.  The patient is awake alert and oriented. She has no facial asymmetry. 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 The plan is for a two-level anterior cervical discectomy with fusion and plating.  Faythe Ghee, MD 12/27/2013, 7:31 AM

## 2013-12-27 NOTE — Anesthesia Postprocedure Evaluation (Signed)
Anesthesia Post Note  Patient: Brittany Bender  Procedure(s) Performed: Procedure(s) (LRB): CERVICAL FOUR TO FIVE, CERVICAL FIVE TO SIX ANTERIOR CERVICAL DECOMPRESSION/DISCECTOMY FUSION 2 LEVELS (N/A)  Anesthesia type: General  Patient location: PACU  Post pain: Pain level controlled and Adequate analgesia  Post assessment: Post-op Vital signs reviewed, Patient's Cardiovascular Status Stable, Respiratory Function Stable, Patent Airway and Pain level controlled  Last Vitals:  Filed Vitals:   12/27/13 1100  BP: 123/67  Pulse: 91  Temp:   Resp: 18    Post vital signs: Reviewed and stable  Level of consciousness: awake, alert  and oriented  Complications: No apparent anesthesia complications

## 2013-12-28 ENCOUNTER — Encounter (HOSPITAL_COMMUNITY): Payer: Self-pay | Admitting: Neurosurgery

## 2013-12-28 DIAGNOSIS — M5012 Cervical disc disorder with radiculopathy, mid-cervical region: Secondary | ICD-10-CM | POA: Diagnosis not present

## 2013-12-28 MED ORDER — HYDROCODONE-ACETAMINOPHEN 5-325 MG PO TABS: 1.0000 | ORAL_TABLET | ORAL | Status: DC | PRN

## 2013-12-28 MED ORDER — DSS 100 MG PO CAPS: 100.0000 mg | ORAL_CAPSULE | Freq: Two times a day (BID) | ORAL | Status: DC

## 2013-12-28 NOTE — Discharge Summary (Signed)
Physician Discharge Summary  Patient ID: Brittany Bender MRN: [id number] DOB/AGE: August 29, 1950 63 y.o.  Admit date: 12/27/2013 Discharge date: 12/28/2013  Admission Diagnoses: C4-5 and C5-6 disc degeneration, herniated disc, spondylosis, cervicalgia, cervical radiculopathy  Discharge Diagnoses: The same Active Problems:   Cervical spondylosis   Discharged Condition: good  Hospital Course: Dr. Hal Neer performed a C4-5 and C5-6 anterior cervical discectomy, fusion, and plating on the patient on 12/27/2013.  The patient's postoperative course was unremarkable. On postoperative day #1 she requested discharge to home. The patient was given oral and written discharge instructions. All her questions were answered.  Consults: None Significant Diagnostic Studies: None Treatments: C4-5 and C5-6 anterior cervical discectomy, fusion, and plating. Discharge Exam: Blood pressure 101/60, pulse 81, temperature 98.3 F (36.8 C), temperature source Oral, resp. rate 18, height 5\' 6"  (1.676 m), weight 122.244 kg (269 lb 8 oz), SpO2 93 %. The patient is alert and pleasant. She looks well. Her dressing is clean and dry. There is no hematoma or shift. The patient's strength is normal.  Disposition: Home     Medication List    ASK your doctor about these medications        acetaminophen 500 MG tablet  Commonly known as:  TYLENOL  Take 1,000 mg by mouth every 12 (twelve) hours as needed for mild pain or moderate pain.     albuterol 108 (90 BASE) MCG/ACT inhaler  Commonly known as:  PROVENTIL HFA;VENTOLIN HFA  Inhale 2 puffs into the lungs every 6 (six) hours as needed. For shortness of breath.     ALPRAZolam 0.5 MG tablet  Commonly known as:  XANAX  Take 1 tablet (0.5 mg total) by mouth at bedtime as needed for anxiety.     BIOTIN PO  Take 1 tablet by mouth daily.     budesonide-formoterol 80-4.5 MCG/ACT inhaler  Commonly known as:  SYMBICORT  Inhale 2 puffs into the lungs as needed.     ciprofloxacin 500 MG tablet  Commonly known as:  CIPRO  Take 500 mg by mouth 2 (two) times daily.     CLA PO  Take 1 tablet by mouth daily.     diclofenac 75 MG EC tablet  Commonly known as:  VOLTAREN  Take 75 mg by mouth as needed.     fish oil-omega-3 fatty acids 1000 MG capsule  Take 1 g by mouth 2 (two) times daily.     levocetirizine 5 MG tablet  Commonly known as:  XYZAL  Take 5 mg by mouth daily as needed for allergies.     methocarbamol 500 MG tablet  Commonly known as:  ROBAXIN  Take 500 mg by mouth every 8 (eight) hours as needed for muscle spasms.     metoprolol tartrate 25 MG tablet  Commonly known as:  LOPRESSOR  Take 1 tablet (25 mg total) by mouth 2 (two) times daily.     montelukast 10 MG tablet  Commonly known as:  SINGULAIR  Take 10 mg by mouth as needed.     multivitamins ther. w/minerals Tabs tablet  Take 2 tablets by mouth daily.     NASACORT ALLERGY 24HR NA  Place 2 sprays into the nose daily as needed (Allergies).     OVER THE COUNTER MEDICATION  Take 1 tablet by mouth as needed. Quick digest     VITAMIN B12 PO  Take 2 tablets by mouth daily.     VITAMIN D-3 PO  Take 2 capsules by mouth daily.  SignedNewman Pies D 12/28/2013, 7:32 AM

## 2013-12-28 NOTE — Discharge Instructions (Signed)
Wound Care Keep incision covered and dry for one week. You may shower from the neck down. You may remove outer bandage after one week.  Do not put any creams, lotions, or ointments on incision. Leave steri-strips on neck.  They will fall off by themselves. Activity Walk each and every day, increasing distance each day. No lifting greater than 5 lbs.  Avoid excessive neck motions.. No driving or riding in car until further notice at follow up appointment. If provided with neck brace, wear at all times unless instructed otherwise. Diet Resume your normal diet.  Return to Work Will be discussed at you follow up appointment. Call Your Doctor If Any of These Occur Redness, drainage, or swelling at the wound.  Temperature greater than 101 degrees. Severe pain not relieved by pain medication. Incision starts to come apart. Increased difficulty swallowing. Follow Up Appt Call today for appointment in 1-2 weeks ([phone]) or for problems.  If you have any hardware placed in your spine, you will need an x-ray before your appointment.   Anterior Cervical Diskectomy and Fusion Anterior cervical diskectomy is surgery done on the upper spine to relieve pressure on one or more nerve roots, or on the spinal cord. There are 7 bones in your neck, called the cervical spine. These 7 bones (vertebrae) sit one on top of the other. Cushions (intervertebral disks) separate the vertebrae and act like shock absorbers. As we age, degeneration of our bones, joints, and disks can cause neck pain and tightening around the spinal cord and nerve roots. This causes arm pain and weakness.  Degeneration involves:  Herniated Disk. With age, the disks dry up and can rupture. In this condition, the center of the disk bulges out (disk herniation). This can cause pressure on a nerve, which produces pain or weakness in the arm.  Bone spurs and spinal stenosis. As we age, growths often develop on our bones. These growths are  called bone spurs (osteophytes). A bone spur is a collection of calcium. As bone spurs grow and extend, the vertebral openings become narrow. The spinal canal and/or the foramen (opening for nerve passageways) become smaller. This narrowing (stenosis) may cause pinching (compression) of the spinal cord or the spinal nerve root. The nerve injury can cause pain, weakness, numbness, and loss of coordination in the upper limbs. Often, patients have difficulty with their hand writing or they start dropping things, because their hand grip is weaker. The spinal cord damage can cause increased stiffness, more frequent falls, electric shooting pain, and changes in bowel and bladder control. Degeneration in the neck results in three common problems:  Radiculopathy - Nerve compression that results in weakness or pain that radiates down the arm.  Myelopathy - Spinal cord compression that causes stiffness, difficulty with walking, coordination, and trouble with bowel or bladder habits.  Neck pain - Worn out joints cause pain as the neck moves. Treatment:  Radiculopathy - Surgery is performed to remove the bony and disk material that is pushing on the nerve.  Myelopathy - Surgery is performed to remove the bony and disk material that pushes on the spinal cord.  Neck pain - Surgery is performed to combine (fuse) the joints of the neck together, so they cannot move or cause pain. Surgery can be done from the front or the back of the neck. When it is done from the front, it is called an anterior (front) cervical (neck) diskectomy (removal of the disk) and fusion. LET YOUR CAREGIVER KNOW ABOUT:  Recent infections.  Any shooting pains down your leg, when you move your neck.  Any difficulty swallowing.  A smoking history.  Use of blood thinners or anti-inflammatory medicines.  Any history of injury to your shoulders.  Any history of injury to your vocal cords.  Any foreign objects in your body from a  previous surgery.  Any recent fevers or illness.  Past medical history (diabetes, strokes).  Past problems with anesthetics.  Possibility of pregnancy.  History of blood clots (deep vein thrombosis).  History of bleeding or blood problems.  Past surgeries.  Other health problems.  Allergies.  Medicines you take, including herbs, eye drops, over-the-counter medicines, and creams.  Use of steroids (by mouth or creams). RISKS AND COMPLICATIONS  Infection.  Bleeding.  Injury to the following structures:  Carotid artery. This can result in a stroke or significant amount of bleeding.  Esophagus, resulting in difficulty swallowing.  Recurrent laryngeal nerve, resulting in hoarseness of the voice.  Spinal cord injury, ranging from mild to complete quadriparesis (muscle weakness in all four limbs).  Nerve root injury, resulting in muscle weakness in the upper limb.  Leakage of cerebrospinal fluid. BEFORE THE PROCEDURE   You will be given medicine to help you sleep (general anesthetic), and a breathing tube will be placed.  You will be given antibiotics to keep the infection rate down.  The incision site on your neck will be marked.  Your neck will be cleaned, to reduce the risk of infection. PROCEDURE  An anterior cervical fusion means that the operation is done through the front (anterior) part of your neck. The cut made by the surgeon (incision) is usually within a skin fold line on the neck. After pushing aside the neck muscles, the surgeon removes the affected, degenerated disk and bone spurs (osteophytes), which takes the pressure off the nerves and spinal cord. This is called a decompression. The area where the disk was removed is then filled with a small piece of plastic. This plastic takes the place of the disk and keeps the nerve passageway (foramen) open and clear for the nerves. In most cases, the surgeon uses metal plates or pins (hardware) in the neck, to help  stabilize the level being fused. The hardware reduces motion at that level, so it can fuse. This provides extra support to the neck. A cervical fusion procedure takes anywhere from a couple to several hours, depending on the size of the neck, history of previous surgery, and number of levels being fused. AFTER THE PROCEDURE   You will likely spend 24-48 hours in the hospital. During this time, your caregivers will look for any signs of complications from the procedure.  Your caregiver will watch you, to make sure that fluid draining from the surgery slows down. It is important that a large mass of blood does not form in your neck, which would cause difficulty with breathing.  You will get 24 hours of antibiotics.  You can start to eat as soon as you feel comfortable.  Once you have started eating, walking, urinating (voiding) and having bowel movements on your own, your caregiver will discharge you home. HOME CARE INSTRUCTIONS   For 2 weeks, do not soak the incision site under water. Do not swim or take baths. Showers are okay, but rinse off the incision sites.  Do not over exert yourself. Allow time for the incision to heal.  It can take from 6 weeks to 6 months for fusion to take effect. Your  caregiver may ask you to wear a neck collar during this time, as they check the fusion with multiple (serial) X-rays. Document Released: 01/02/2009 Document Revised: 05/11/2012 Document Reviewed: 01/02/2009 [facility] Patient Information 2015 [place], [place]. This information is not intended to replace advice given to you by your health care provider. Make sure you discuss any questions you have with your health care provider.

## 2013-12-28 NOTE — Progress Notes (Signed)
Pt doing well. Pt given D/C instructions with Rx, verbal understanding was provided. Pt's incision is clean, dry, and has no sign of infection. Pt's IV was removed prior to D/C. Pt D/C'd home via wheelchair @ 1040 per MD order. Pt is stable @ D/C and has no other needs at this time. Holli Humbles, RN

## 2014-02-02 ENCOUNTER — Other Ambulatory Visit: Payer: Self-pay | Admitting: Neurosurgery

## 2014-02-02 DIAGNOSIS — M5022 Other cervical disc displacement, mid-cervical region, unspecified level: Secondary | ICD-10-CM

## 2014-02-06 ENCOUNTER — Ambulatory Visit
Admission: RE | Admit: 2014-02-06 | Discharge: 2014-02-06 | Disposition: A | Discharge status: Home / Self Care | Payer: BC Managed Care – PPO | Source: Ambulatory Visit | Attending: Neurosurgery | Admitting: Neurosurgery

## 2014-02-06 DIAGNOSIS — M5022 Other cervical disc displacement, mid-cervical region, unspecified level: Secondary | ICD-10-CM

## 2014-02-07 ENCOUNTER — Other Ambulatory Visit: Payer: Self-pay | Admitting: Internal Medicine

## 2014-02-07 ENCOUNTER — Other Ambulatory Visit: Payer: Self-pay

## 2014-02-07 DIAGNOSIS — N63 Unspecified lump in unspecified breast: Secondary | ICD-10-CM

## 2014-03-10 ENCOUNTER — Ambulatory Visit
Admission: RE | Admit: 2014-03-10 | Discharge: 2014-03-10 | Disposition: A | Discharge status: Home / Self Care | Payer: BC Managed Care – PPO | Source: Ambulatory Visit | Attending: Internal Medicine | Admitting: Internal Medicine

## 2014-03-10 DIAGNOSIS — N63 Unspecified lump in unspecified breast: Secondary | ICD-10-CM

## 2014-06-28 ENCOUNTER — Telehealth: Payer: Self-pay | Admitting: Internal Medicine

## 2014-06-28 MED ORDER — ALPRAZOLAM 0.5 MG PO TABS: 0.5000 mg | ORAL_TABLET | Freq: Every evening | ORAL | Status: DC | PRN

## 2014-06-28 NOTE — Telephone Encounter (Signed)
Refill once 

## 2014-06-28 NOTE — Telephone Encounter (Signed)
Patient calling for refill on her Rx for Xanax.  Pharmacy is [facility].    I did book her PE for labs 9/30 and PE on 10/3.

## 2014-07-04 ENCOUNTER — Other Ambulatory Visit: Payer: Self-pay

## 2014-07-04 DIAGNOSIS — Z1231 Encounter for screening mammogram for malignant neoplasm of breast: Secondary | ICD-10-CM

## 2014-08-15 ENCOUNTER — Ambulatory Visit
Admission: RE | Admit: 2014-08-15 | Discharge: 2014-08-15 | Disposition: A | Discharge status: Home / Self Care | Payer: BC Managed Care – PPO | Source: Ambulatory Visit

## 2014-08-15 DIAGNOSIS — Z1231 Encounter for screening mammogram for malignant neoplasm of breast: Secondary | ICD-10-CM

## 2014-10-13 ENCOUNTER — Other Ambulatory Visit: Payer: Self-pay | Admitting: Interventional Cardiology

## 2014-10-28 ENCOUNTER — Other Ambulatory Visit: Payer: BC Managed Care – PPO | Admitting: Internal Medicine

## 2014-10-28 DIAGNOSIS — Z1329 Encounter for screening for other suspected endocrine disorder: Secondary | ICD-10-CM

## 2014-10-28 DIAGNOSIS — Z0000 Encounter for general adult medical examination without abnormal findings: Secondary | ICD-10-CM

## 2014-10-28 DIAGNOSIS — Z13220 Encounter for screening for lipoid disorders: Secondary | ICD-10-CM

## 2014-10-28 DIAGNOSIS — Z1321 Encounter for screening for nutritional disorder: Secondary | ICD-10-CM

## 2014-10-28 DIAGNOSIS — Z130 Encounter for screening for diseases of the blood and blood-forming organs and certain disorders involving the immune mechanism: Secondary | ICD-10-CM

## 2014-10-28 LAB — CBC WITH DIFFERENTIAL/PLATELET
BASOS PCT: 1 % (ref 0–1)
Basophils Absolute: 0 10*3/uL (ref 0.0–0.1)
EOS PCT: 3 % (ref 0–5)
Eosinophils Absolute: 0.1 10*3/uL (ref 0.0–0.7)
HCT: 34.5 % — ABNORMAL LOW (ref 36.0–46.0)
Hemoglobin: 11.5 g/dL — ABNORMAL LOW (ref 12.0–15.0)
Lymphocytes Relative: 48 % — ABNORMAL HIGH (ref 12–46)
Lymphs Abs: 1.5 10*3/uL (ref 0.7–4.0)
MCH: 30.3 pg (ref 26.0–34.0)
MCHC: 33.3 g/dL (ref 30.0–36.0)
MCV: 91 fL (ref 78.0–100.0)
MONOS PCT: 8 % (ref 3–12)
MPV: 9.2 fL (ref 8.6–12.4)
Monocytes Absolute: 0.3 10*3/uL (ref 0.1–1.0)
Neutro Abs: 1.3 10*3/uL — ABNORMAL LOW (ref 1.7–7.7)
Neutrophils Relative %: 40 % — ABNORMAL LOW (ref 43–77)
Platelets: 248 10*3/uL (ref 150–400)
RBC: 3.79 MIL/uL — AB (ref 3.87–5.11)
RDW: 15.7 % — AB (ref 11.5–15.5)
WBC: 3.2 10*3/uL — ABNORMAL LOW (ref 4.0–10.5)

## 2014-10-28 LAB — LIPID PANEL
CHOL/HDL RATIO: 2.8 ratio (ref <=5.0)
Cholesterol: 194 mg/dL (ref 125–200)
HDL: 70 mg/dL (ref >=46)
LDL CALC: 110 mg/dL (ref <130)
Triglycerides: 70 mg/dL (ref <150)
VLDL: 14 mg/dL (ref <30)

## 2014-10-28 LAB — COMPLETE METABOLIC PANEL WITH GFR
ALBUMIN: 3.9 g/dL (ref 3.6–5.1)
ALK PHOS: 74 U/L (ref 33–130)
ALT: 5 U/L — ABNORMAL LOW (ref 6–29)
AST: 14 U/L (ref 10–35)
BUN: 9 mg/dL (ref 7–25)
CALCIUM: 8.7 mg/dL (ref 8.6–10.4)
CHLORIDE: 107 mmol/L (ref 98–110)
CO2: 23 mmol/L (ref 20–31)
Creat: 0.74 mg/dL (ref 0.50–0.99)
GFR, EST NON AFRICAN AMERICAN: 87 mL/min (ref >=60)
GFR, Est African American: >89 mL/min (ref >=60)
Glucose, Bld: 92 mg/dL (ref 65–99)
POTASSIUM: 3.7 mmol/L (ref 3.5–5.3)
SODIUM: 140 mmol/L (ref 135–146)
Total Bilirubin: 0.5 mg/dL (ref 0.2–1.2)
Total Protein: 7.1 g/dL (ref 6.1–8.1)

## 2014-10-28 LAB — TSH: TSH: 1.737 u[IU]/mL (ref 0.350–4.500)

## 2014-10-29 LAB — VITAMIN D 25 HYDROXY (VIT D DEFICIENCY, FRACTURES): VIT D 25 HYDROXY: 20 ng/mL — AB (ref 30–100)

## 2014-10-31 ENCOUNTER — Encounter: Payer: Self-pay | Admitting: Internal Medicine

## 2014-10-31 ENCOUNTER — Ambulatory Visit (INDEPENDENT_AMBULATORY_CARE_PROVIDER_SITE_OTHER): Payer: BC Managed Care – PPO | Admitting: Internal Medicine

## 2014-10-31 VITALS — BP 118/72 | HR 86 | Temp 98.0°F | Ht 66.0 in | Wt 267.0 lb

## 2014-10-31 DIAGNOSIS — I471 Supraventricular tachycardia: Secondary | ICD-10-CM | POA: Diagnosis not present

## 2014-10-31 DIAGNOSIS — J309 Allergic rhinitis, unspecified: Secondary | ICD-10-CM | POA: Diagnosis not present

## 2014-10-31 DIAGNOSIS — J4520 Mild intermittent asthma, uncomplicated: Secondary | ICD-10-CM

## 2014-10-31 DIAGNOSIS — Z0000 Encounter for general adult medical examination without abnormal findings: Secondary | ICD-10-CM | POA: Diagnosis not present

## 2014-10-31 DIAGNOSIS — I519 Heart disease, unspecified: Secondary | ICD-10-CM

## 2014-10-31 DIAGNOSIS — G4733 Obstructive sleep apnea (adult) (pediatric): Secondary | ICD-10-CM

## 2014-10-31 DIAGNOSIS — Z23 Encounter for immunization: Secondary | ICD-10-CM | POA: Diagnosis not present

## 2014-10-31 DIAGNOSIS — E559 Vitamin D deficiency, unspecified: Secondary | ICD-10-CM

## 2014-10-31 DIAGNOSIS — E669 Obesity, unspecified: Secondary | ICD-10-CM

## 2014-10-31 DIAGNOSIS — I5189 Other ill-defined heart diseases: Secondary | ICD-10-CM

## 2014-10-31 DIAGNOSIS — Z853 Personal history of malignant neoplasm of breast: Secondary | ICD-10-CM | POA: Diagnosis not present

## 2014-10-31 LAB — POCT URINALYSIS DIPSTICK
Bilirubin, UA: NEGATIVE
GLUCOSE UA: NEGATIVE
KETONES UA: NEGATIVE
Leukocytes, UA: NEGATIVE
NITRITE UA: NEGATIVE
Protein, UA: NEGATIVE
RBC UA: NEGATIVE
Spec Grav, UA: 1.01
Urobilinogen, UA: NEGATIVE
pH, UA: 6.5

## 2014-10-31 MED ORDER — MONTELUKAST SODIUM 10 MG PO TABS: 10.0000 mg | ORAL_TABLET | ORAL | Status: DC | PRN

## 2014-10-31 MED ORDER — LEVOCETIRIZINE DIHYDROCHLORIDE 5 MG PO TABS: 5.0000 mg | ORAL_TABLET | Freq: Every day | ORAL | Status: DC | PRN

## 2014-10-31 MED ORDER — METOPROLOL TARTRATE 25 MG PO TABS: 25.0000 mg | ORAL_TABLET | Freq: Two times a day (BID) | ORAL | Status: DC

## 2014-10-31 MED ORDER — ALPRAZOLAM 0.5 MG PO TABS: 0.5000 mg | ORAL_TABLET | Freq: Every evening | ORAL | Status: DC | PRN

## 2014-11-24 ENCOUNTER — Other Ambulatory Visit: Payer: Self-pay | Admitting: Obstetrics

## 2014-11-24 DIAGNOSIS — B373 Candidiasis of vulva and vagina: Secondary | ICD-10-CM

## 2014-11-24 DIAGNOSIS — B3731 Acute candidiasis of vulva and vagina: Secondary | ICD-10-CM

## 2014-11-24 DIAGNOSIS — N390 Urinary tract infection, site not specified: Secondary | ICD-10-CM

## 2014-11-24 MED ORDER — FLUCONAZOLE 150 MG PO TABS: 150.0000 mg | ORAL_TABLET | Freq: Once | ORAL | Status: DC

## 2014-11-24 MED ORDER — CIPROFLOXACIN HCL 500 MG PO TABS
500.0000 mg | ORAL_TABLET | Freq: Two times a day (BID) | ORAL | Status: DC
Start: 2014-11-24 — End: 2015-01-25

## 2014-11-26 ENCOUNTER — Encounter: Payer: Self-pay | Admitting: Internal Medicine

## 2014-11-26 NOTE — Progress Notes (Signed)
Subjective:    Patient ID: Brittany Bender, female    DOB: February 19, 1950, 64 y.o.   MRN: [id number]  HPI 64 year old white female for health maintenance exam and evaluation of medical issues. She is status post right breast cancer with lumpectomy perform a Dr. Lennie Hummer in 2000. History of hysterectomy and bilateral oophorectomy by Dr. Gertie Fey in 2004. Surgery for herniated disc L4-L5 by Dr. Luiz Ochoa in 2012. History of asthma and allergic rhinitis. C4-C6 surgery by Dr. Hal Neer November 2015.  History of fractured right foot requiring surgical pinning 1997.  History of colonoscopy by Dr. Erskine Emery showing diverticulosis March 2008.  Issues with tachycardia. Has been diagnosed with sleep apnea by Dr. Normajean Baxter. Has seen Dr. Pernell Dupre for tachycardia. 24-hour Holter monitor showed pulse between 101 148. Stress test was negative. 2-D echocardiogram showed normal ejection fraction but diastolic dysfunction. Seems to be under better control at the present time.  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 She resides in [place] and commutes  to work. She is divorced. Has a college education. Does not smoke. Social alcohol consumption. No children.  Family history: Father died of an asthma attack. Mother died of natural causes with history of dementia. One brother died accidentally is a car fell on him. One sister killed in a motor vehicle active by drunk driver. 6 brothers living. One sister living with history of MI and smoking. 2 brothers living with history of coronary artery disease status post CABG with history of smoking.    Review of Systems  Constitutional: Negative.   All other systems reviewed and are negative.      Objective:   Physical Exam  Constitutional: She is oriented to person, place, and time. She appears well-developed and well-nourished. No distress.  HENT:  Head: Normocephalic and atraumatic.  Right Ear: External ear  normal.  Left Ear: External ear normal.  Mouth/Throat: Oropharynx is clear and moist. No oropharyngeal exudate.  Eyes: Conjunctivae and EOM are normal. Pupils are equal, round, and reactive to light. Right eye exhibits no discharge. Left eye exhibits no discharge. No scleral icterus.  Neck: Neck supple. No JVD present. No thyromegaly present.  Cardiovascular: Normal rate, regular rhythm and normal heart sounds.   No murmur heard. Pulmonary/Chest: Effort normal and breath sounds normal. She has no wheezes. She has no rales.  Abdominal: Soft. Bowel sounds are normal. She exhibits no mass. There is no tenderness. There is no rebound and no guarding.  Musculoskeletal: She exhibits no edema.  Lymphadenopathy:    She has no cervical adenopathy.  Neurological: She is alert and oriented to person, place, and time. She has normal reflexes. No cranial nerve deficit. Coordination normal.  Skin: Skin is warm and dry. No rash noted. She is not diaphoretic.  Psychiatric: She has a normal mood and affect. Her behavior is normal. Judgment and thought content normal.  Vitals reviewed.         Assessment & Plan:  Obesity-continue diet and exercise efforts  History of asthma  History of allergic rhinitis  History of breast cancer  Anxiety/insomnia-has Xanax on hand.  History of herniated disc L4-L5 status post surgery. Takes Flexeril from time to time for back pain  Sleep apnea-diagnosed by Dr. Nigel Bridgeman by Dr. Pernell Dupre who thought she had physical deconditioning and sleep apnea causing tachycardia. Treated with metoprolol. Refilled.  Diastolic dysfunction by echocardiogram  C4 - C6   repair by Dr. Hal Neer November  2015. Presented as left arm radiculopathy.  Vitamin D deficiency-take 2000 units vitamin D 3 daily

## 2014-11-26 NOTE — Patient Instructions (Signed)
It was pleasure to see you today. Continue same medications. Take 2000 units vitamin D 3 daily for vitamin D deficiency. Return annually.

## 2015-01-25 ENCOUNTER — Ambulatory Visit (INDEPENDENT_AMBULATORY_CARE_PROVIDER_SITE_OTHER): Payer: BC Managed Care – PPO | Admitting: Podiatry

## 2015-01-25 ENCOUNTER — Ambulatory Visit (INDEPENDENT_AMBULATORY_CARE_PROVIDER_SITE_OTHER): Payer: BC Managed Care – PPO

## 2015-01-25 ENCOUNTER — Encounter: Payer: Self-pay | Admitting: Podiatry

## 2015-01-25 VITALS — BP 134/80 | HR 74 | Resp 16 | Ht 66.0 in | Wt 265.0 lb

## 2015-01-25 DIAGNOSIS — M79672 Pain in left foot: Secondary | ICD-10-CM

## 2015-01-25 DIAGNOSIS — M7661 Achilles tendinitis, right leg: Secondary | ICD-10-CM

## 2015-01-25 DIAGNOSIS — M79671 Pain in right foot: Secondary | ICD-10-CM

## 2015-01-25 MED ORDER — TRIAMCINOLONE ACETONIDE 10 MG/ML IJ SUSP
10.0000 mg | Freq: Once | INTRAMUSCULAR | Status: AC
  Administered 2015-01-25: 10 mg

## 2015-01-25 MED ORDER — DICLOFENAC SODIUM 75 MG PO TBEC: 75.0000 mg | DELAYED_RELEASE_TABLET | Freq: Two times a day (BID) | ORAL | Status: DC

## 2015-01-25 NOTE — Patient Instructions (Signed)

## 2015-01-25 NOTE — Progress Notes (Signed)
   Subjective:    Patient ID: Brittany Bender, female    DOB: 10-22-1950, 64 y.o.   MRN: [id number]  HPI Patient presents with bilateral foot pain; back of heel pain. Pt stated, "has more pain in their right foot"; pt wants injections in feet.   Review of Systems  All other systems reviewed and are negative.      Objective:   Physical Exam        Assessment & Plan:

## 2015-01-26 NOTE — Progress Notes (Signed)
Subjective:     Patient ID: Brittany Bender, female   DOB: 1950-04-28, 64 y.o.   MRN: [id number]  HPI patient has quite a bit of discomfort in the posterior aspect of the right heel on the lateral side and states it hurts especially after she's been sitting or when she gets up in the morning. It's been going on now for several months and obesity is complicating factor   Review of Systems  All other systems reviewed and are negative.      Objective:   Physical Exam  Constitutional: She is oriented to person, place, and time.  Cardiovascular: Intact distal pulses.   Musculoskeletal: Normal range of motion.  Neurological: She is oriented to person, place, and time.  Skin: Skin is warm.  Nursing note and vitals reviewed.  neurovascular status intact muscle strength adequate range of motion within normal limits with patient found to have exquisite discomfort posterior lateral aspect right heel at the insertion of the Achilles with a central and medial portion the tendon intact and healthy. Left is slightly tender but nowhere near to the same degree and there is moderate equinus condition     Assessment:     Achilles tendinitis with obesity is complicating factor    Plan:     H&P condition reviewed with patient and discussed careful injection with risk of rupture associated with it. She is willing to accept risk and today I did a careful lateral injection 3 mg Dexon some Kenalog 5 mg Xylocaine advised on reduced activity ice therapy and dispensed night splint with instructions on usage to try to stretch the posterior and plantar heel. Patient will be seen back to recheck in 3 weeks or earlier if needed

## 2015-03-28 ENCOUNTER — Telehealth: Payer: Self-pay | Admitting: Internal Medicine

## 2015-03-28 NOTE — Telephone Encounter (Signed)
Offered patient appointment for today at 11:45.  Patient declined.  States she's at work in [place].  Gave appointment for Thursday a.m. 3/2 @ 9:45.  Patient asked for first appointment in the day.  Advised she may have to wait a bit and be worked in as this appointment has already been booked.

## 2015-03-28 NOTE — Telephone Encounter (Signed)
Has a cold since Saturday.  Didn't work yesterday.  No fever.  No color to the sputum.  She is afraid it may go to bronchitis.  She has codeine medication for the cough.  She was wanting you to call something in for her.  Advised patient that she would need to be seen so that you could listen to her lungs and be sure there is no pneumonia, etc.    Please advise.

## 2015-03-28 NOTE — Telephone Encounter (Signed)
See today

## 2015-03-30 ENCOUNTER — Ambulatory Visit (INDEPENDENT_AMBULATORY_CARE_PROVIDER_SITE_OTHER): Payer: BC Managed Care – PPO | Admitting: Internal Medicine

## 2015-03-30 ENCOUNTER — Encounter: Payer: Self-pay | Admitting: Internal Medicine

## 2015-03-30 VITALS — BP 138/86 | HR 98 | Temp 97.8°F | Resp 20 | Ht 66.0 in | Wt 262.0 lb

## 2015-03-30 DIAGNOSIS — J309 Allergic rhinitis, unspecified: Secondary | ICD-10-CM

## 2015-03-30 DIAGNOSIS — J069 Acute upper respiratory infection, unspecified: Secondary | ICD-10-CM

## 2015-03-30 DIAGNOSIS — H65193 Other acute nonsuppurative otitis media, bilateral: Secondary | ICD-10-CM | POA: Diagnosis not present

## 2015-03-30 MED ORDER — LEVOFLOXACIN 500 MG PO TABS: 500.0000 mg | ORAL_TABLET | Freq: Every day | ORAL | Status: DC

## 2015-03-30 MED ORDER — FLUCONAZOLE 150 MG PO TABS: 150.0000 mg | ORAL_TABLET | Freq: Once | ORAL | Status: DC

## 2015-03-30 MED ORDER — ALBUTEROL SULFATE HFA 108 (90 BASE) MCG/ACT IN AERS: 2.0000 | INHALATION_SPRAY | Freq: Four times a day (QID) | RESPIRATORY_TRACT | Status: DC | PRN

## 2015-03-30 MED ORDER — METHYLPREDNISOLONE ACETATE 80 MG/ML IJ SUSP
80.0000 mg | Freq: Once | INTRAMUSCULAR | Status: AC
  Administered 2015-03-30: 80 mg via INTRAMUSCULAR

## 2015-03-30 NOTE — Progress Notes (Signed)
   Subjective:    Patient ID: Brittany Bender, female    DOB: [birth year], 65 y.o.   MRN: [id number]  HPI Patient has come down with acute respiratory infection symptoms. She's had cough congestion scratchy throat. Ears and been congested. She has a history of allergic rhinitis. No fever or shaking chills. No myalgias. Stayed in the bed this past weekend. Has not improved. Very little sputum production with cough.    Review of Systems     Objective:   Physical Exam  She has bilateral serous otitis media. Pharynx is red without exudate. Skin is warm and dry. Neck is supple without adenopathy. Chest clear to auscultation without rales or wheezing. Cardiac exam regular rate and rhythm. Extremities without edema.      Assessment & Plan:  Acute bilateral serous otitis media  Pharyngitis  Acute URI  Plan: Levaquin 500 milligrams daily for 10 days. She has narcotic cough syrup prescribe a Dr. Jodi Mourning. She is to continue with that. Rest and drink plenty of fluids. May use her allergy medications such as Ventolin inhaler, Xyzal, Singulair, Flonase. Depo-Medrol 80 mg IM. Diflucan if needed for Candida vaginitis.

## 2015-03-30 NOTE — Patient Instructions (Signed)
Depo-Medrol 80 mg IM. Levaquin 500 milligrams daily for 10 days. Continue with narcotic cough syrup as needed. Continue allergy medications as previously prescribed. Diflucan if needed for Candida vaginitis.

## 2015-06-05 ENCOUNTER — Other Ambulatory Visit: Payer: Self-pay | Admitting: Internal Medicine

## 2015-08-22 ENCOUNTER — Other Ambulatory Visit: Payer: Self-pay | Admitting: Internal Medicine

## 2015-08-22 DIAGNOSIS — Z1231 Encounter for screening mammogram for malignant neoplasm of breast: Secondary | ICD-10-CM

## 2015-09-04 ENCOUNTER — Ambulatory Visit
Admission: RE | Admit: 2015-09-04 | Discharge: 2015-09-04 | Disposition: A | Discharge status: Home / Self Care | Payer: BC Managed Care – PPO | Source: Ambulatory Visit | Attending: Internal Medicine | Admitting: Internal Medicine

## 2015-09-04 DIAGNOSIS — Z1231 Encounter for screening mammogram for malignant neoplasm of breast: Secondary | ICD-10-CM

## 2015-12-11 ENCOUNTER — Ambulatory Visit (INDEPENDENT_AMBULATORY_CARE_PROVIDER_SITE_OTHER): Payer: BC Managed Care – PPO

## 2015-12-11 ENCOUNTER — Encounter: Payer: Self-pay | Admitting: Podiatry

## 2015-12-11 ENCOUNTER — Ambulatory Visit (INDEPENDENT_AMBULATORY_CARE_PROVIDER_SITE_OTHER): Payer: BC Managed Care – PPO | Admitting: Podiatry

## 2015-12-11 VITALS — BP 123/78 | HR 84 | Resp 16

## 2015-12-11 DIAGNOSIS — M7661 Achilles tendinitis, right leg: Secondary | ICD-10-CM | POA: Diagnosis not present

## 2015-12-11 DIAGNOSIS — D169 Benign neoplasm of bone and articular cartilage, unspecified: Secondary | ICD-10-CM | POA: Diagnosis not present

## 2015-12-11 DIAGNOSIS — M79672 Pain in left foot: Secondary | ICD-10-CM

## 2015-12-11 DIAGNOSIS — M79671 Pain in right foot: Secondary | ICD-10-CM

## 2015-12-11 MED ORDER — TRIAMCINOLONE ACETONIDE 10 MG/ML IJ SUSP
10.0000 mg | Freq: Once | INTRAMUSCULAR | Status: AC
  Administered 2015-12-11: 10 mg

## 2015-12-13 NOTE — Progress Notes (Signed)
Subjective:     Patient ID: Brittany Bender, female   DOB: 1950/04/07, 65 y.o.   MRN: [id number]  HPI patient presents stating that the right heel has started to bother her again and she would like to wear her boot would like medication and she has a spur on top of her left foot that's becoming increasingly tender   Review of Systems     Objective:   Physical Exam Neurovascular status intact muscle strength adequate with inflammation of the medial side of the Achilles tendon right with the central lateral band doing well with discomfort across the top of the left foot where there is a large prominence in the midtarsal area    Assessment:     Acute Achilles tendinitis right with dorsal exostosis left    Plan:     Careful injection medial side after explaining chances for risk and rupture with patient. I utilized 3 mg Dexon some Kenalog 5 mill grams Xylocaine advised on reduced activity and x-rayed the left and discussed with patient with spur that we'll need to be removed at one point  X-ray indicates there is a large spur on top of the midtarsal joint left with inflammation of tissue

## 2015-12-14 ENCOUNTER — Other Ambulatory Visit: Payer: BC Managed Care – PPO | Admitting: Internal Medicine

## 2015-12-14 DIAGNOSIS — E559 Vitamin D deficiency, unspecified: Secondary | ICD-10-CM

## 2015-12-14 DIAGNOSIS — Z1329 Encounter for screening for other suspected endocrine disorder: Secondary | ICD-10-CM

## 2015-12-14 DIAGNOSIS — R7302 Impaired glucose tolerance (oral): Secondary | ICD-10-CM

## 2015-12-14 DIAGNOSIS — Z13220 Encounter for screening for lipoid disorders: Secondary | ICD-10-CM

## 2015-12-14 DIAGNOSIS — Z130 Encounter for screening for diseases of the blood and blood-forming organs and certain disorders involving the immune mechanism: Secondary | ICD-10-CM

## 2015-12-14 LAB — CBC WITH DIFFERENTIAL/PLATELET
BASOS PCT: 0 %
Basophils Absolute: 0 cells/uL (ref 0–200)
EOS ABS: 58 {cells}/uL (ref 15–500)
Eosinophils Relative: 2 %
HEMATOCRIT: 36.3 % (ref 35.0–45.0)
Hemoglobin: 11.9 g/dL (ref 11.7–15.5)
LYMPHS PCT: 46 %
Lymphs Abs: 1334 cells/uL (ref 850–3900)
MCH: 29.9 pg (ref 27.0–33.0)
MCHC: 32.8 g/dL (ref 32.0–36.0)
MCV: 91.2 fL (ref 80.0–100.0)
MONO ABS: 261 {cells}/uL (ref 200–950)
MONOS PCT: 9 %
MPV: 9.4 fL (ref 7.5–12.5)
NEUTROS PCT: 43 %
Neutro Abs: 1247 cells/uL — ABNORMAL LOW (ref 1500–7800)
PLATELETS: 265 10*3/uL (ref 140–400)
RBC: 3.98 MIL/uL (ref 3.80–5.10)
RDW: 15.9 % — AB (ref 11.0–15.0)
WBC: 2.9 10*3/uL — AB (ref 3.8–10.8)

## 2015-12-14 LAB — LIPID PANEL
CHOL/HDL RATIO: 2.5 ratio (ref <5.0)
CHOLESTEROL: 219 mg/dL — AB (ref <200)
HDL: 87 mg/dL (ref >50)
LDL Cholesterol: 120 mg/dL — ABNORMAL HIGH (ref <100)
Triglycerides: 62 mg/dL (ref <150)
VLDL: 12 mg/dL (ref <30)

## 2015-12-14 LAB — COMPLETE METABOLIC PANEL WITH GFR
ALT: 9 U/L (ref 6–29)
AST: 17 U/L (ref 10–35)
Albumin: 4.2 g/dL (ref 3.6–5.1)
Alkaline Phosphatase: 69 U/L (ref 33–130)
BUN: 10 mg/dL (ref 7–25)
CALCIUM: 9.4 mg/dL (ref 8.6–10.4)
CHLORIDE: 105 mmol/L (ref 98–110)
CO2: 26 mmol/L (ref 20–31)
CREATININE: 0.76 mg/dL (ref 0.50–0.99)
GFR, EST NON AFRICAN AMERICAN: 84 mL/min (ref >=60)
Glucose, Bld: 87 mg/dL (ref 65–99)
POTASSIUM: 4 mmol/L (ref 3.5–5.3)
Sodium: 140 mmol/L (ref 135–146)
Total Bilirubin: 0.4 mg/dL (ref 0.2–1.2)
Total Protein: 7.8 g/dL (ref 6.1–8.1)

## 2015-12-14 LAB — TSH: TSH: 1.68 mIU/L

## 2015-12-14 MED ORDER — METOPROLOL TARTRATE 25 MG PO TABS: 25.0000 mg | ORAL_TABLET | Freq: Two times a day (BID) | ORAL | 1 refills | Status: DC

## 2015-12-15 ENCOUNTER — Ambulatory Visit (INDEPENDENT_AMBULATORY_CARE_PROVIDER_SITE_OTHER): Payer: BC Managed Care – PPO | Admitting: Internal Medicine

## 2015-12-15 ENCOUNTER — Encounter: Payer: Self-pay | Admitting: Internal Medicine

## 2015-12-15 VITALS — BP 130/84 | HR 87 | Temp 98.6°F | Ht 64.5 in | Wt 268.0 lb

## 2015-12-15 DIAGNOSIS — Z0000 Encounter for general adult medical examination without abnormal findings: Secondary | ICD-10-CM

## 2015-12-15 DIAGNOSIS — I519 Heart disease, unspecified: Secondary | ICD-10-CM

## 2015-12-15 DIAGNOSIS — R7302 Impaired glucose tolerance (oral): Secondary | ICD-10-CM | POA: Diagnosis not present

## 2015-12-15 DIAGNOSIS — E559 Vitamin D deficiency, unspecified: Secondary | ICD-10-CM | POA: Diagnosis not present

## 2015-12-15 DIAGNOSIS — Z23 Encounter for immunization: Secondary | ICD-10-CM | POA: Diagnosis not present

## 2015-12-15 DIAGNOSIS — I5189 Other ill-defined heart diseases: Secondary | ICD-10-CM

## 2015-12-15 DIAGNOSIS — E7800 Pure hypercholesterolemia, unspecified: Secondary | ICD-10-CM

## 2015-12-15 DIAGNOSIS — Z87898 Personal history of other specified conditions: Secondary | ICD-10-CM

## 2015-12-15 LAB — VITAMIN D 25 HYDROXY (VIT D DEFICIENCY, FRACTURES): VIT D 25 HYDROXY: 23 ng/mL — AB (ref 30–100)

## 2015-12-15 LAB — POCT URINALYSIS DIPSTICK
BILIRUBIN UA: NEGATIVE
GLUCOSE UA: NEGATIVE
Leukocytes, UA: NEGATIVE
Nitrite, UA: NEGATIVE
Protein, UA: NEGATIVE
RBC UA: NEGATIVE
SPEC GRAV UA: 1.015
Urobilinogen, UA: NEGATIVE
pH, UA: 6

## 2015-12-15 LAB — HEMOGLOBIN A1C
HEMOGLOBIN A1C: 5.6 % (ref <5.7)
MEAN PLASMA GLUCOSE: 114 mg/dL

## 2015-12-15 LAB — VITAMIN B12: Vitamin B-12: 520 pg/mL (ref 200–1100)

## 2015-12-15 NOTE — Patient Instructions (Addendum)
It was pleasure to see you today. Flu vaccine given. Work on diet exercise and weight loss and return in 6 months for office visit, CBC with differential, lipid panel and hemoglobin A1c. Take 2000 units vitamin D 3 daily.

## 2015-12-27 NOTE — Progress Notes (Signed)
Subjective:    Patient ID: Brittany Bender, female    DOB: 1950-07-20, 65 y.o.   MRN: [id number]  HPI pleasant 65 year old black female in today for health maintenance exam and evaluation of medical issues. She is status post right breast cancer with lumpectomy performed by Dr. Lennie Hummer in 2000. History of hysterectomy and bilateral oophorectomy by Dr. Kela Millin in 2004. History for herniated disc L4-L5 by Dr. Luiz Ochoa in 2012. History of asthma and allergic rhinitis. C4-C6 surgery by Dr. Hal Neer November 2015.  History of fractured right foot requiring surgical pinning 1997.  History of colonoscopy by Dr. Erskine Emery showing diverticulosis March 2008.  Has been diagnosed with sleep apnea by Dr. Gwenette Greet.  Has seen Dr. Pernell Dupre for tachycardia. 24-hour Holter monitor showed pulse between 101 and 148. Stress test was negative. 2-D echocardiogram showed a normal ejection fraction but diastolic dysfunction. Seems to be under better control.  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 She is divorced. Has college education. Does not smoke. Social alcohol consumption. No children.  Family history: Father died of an asthma attack. Mother died of natural causes with history of dementia. One brother died accidentally when a car fell on him. One sister was killed in a motor vehicle accident by drunk driver. 6 brothers. One sister living with history of MI and smoking. 2 brothers with history of coronary artery disease status post CABG and history of smoking.    Review of Systems  Constitutional: Negative.   All other systems reviewed and are negative.      Objective:   Physical Exam  Constitutional: She is oriented to person, place, and time. She appears well-developed and well-nourished. No distress.  HENT:  Head: Normocephalic and atraumatic.  Right Ear: External ear normal.  Left Ear: External ear  normal.  Mouth/Throat: Oropharynx is clear and moist. No oropharyngeal exudate.  Eyes: Conjunctivae are normal. Pupils are equal, round, and reactive to light. Right eye exhibits no discharge. Left eye exhibits no discharge. No scleral icterus.  Neck: Neck supple. No JVD present. No thyromegaly present.  Cardiovascular: Normal rate, regular rhythm, normal heart sounds and intact distal pulses.   No murmur heard. Pulmonary/Chest: Effort normal and breath sounds normal. She has no wheezes. She has no rales.  Breasts normal female without masses  Abdominal: Soft. Bowel sounds are normal. She exhibits no distension and no mass. There is no tenderness. There is no rebound and no guarding.  Musculoskeletal: She exhibits no edema.  Lymphadenopathy:    She has no cervical adenopathy.  Neurological: She is alert and oriented to person, place, and time. She has normal reflexes. No cranial nerve deficit. Coordination normal.  Skin: Skin is warm and dry. No rash noted. She is not diaphoretic.  Psychiatric: She has a normal mood and affect. Her behavior is normal. Judgment and thought content normal.  Vitals reviewed.         Assessment & Plan:  Impaired glucose tolerance-hemoglobin A1c 2 years ago was 6% and is now 5.6% which represents a normal level at present time. Continue diet exercise efforts.  Vitamin D deficiency-level is 23. Take 2000 units vitamin D 3 daily  Hyperlipidemia-total cholesterol 219 and LDL cholesterol 120. Triglycerides are normal. HDL cholesterol 87. One year ago lipid panel was normal. 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 One year ago when blood cell count  was 3200. This is common in black individuals. It will be monitored. In 2015 White blood cell count was 4900 in November and 3800 in September. In 2014 White blood cell count was 3600  In 2013 Pap was normal  Obesity  History of asthma  History of allergic rhinitis  History  of breast cancer-Have annual mammogram  Anxiety and insomnia  Herniated disc L4-L5 status post surgery  Sleep apnea  History of tachycardia treated with metoprolol  Diastolic dysfunction by echocardiogram  C4-C6 repair by Dr. Hal Neer November 2015 which presented as left arm radiculopathy  Plan: Continue to work on diet and exercise and return in 6 months for office visit, CBC with differential, lipid panel, hemoglobin A1c or as needed. Take vitamin D supplement.

## 2015-12-29 ENCOUNTER — Telehealth: Payer: Self-pay | Admitting: Internal Medicine

## 2015-12-29 MED ORDER — AZITHROMYCIN 250 MG PO TABS: ORAL_TABLET | ORAL | 0 refills | Status: DC

## 2015-12-29 NOTE — Telephone Encounter (Signed)
Patient states she has a cold and a dry, non-productive cough.  Chest feels congested and head stopped up, but she can't seem to get anything to move.  No fever.  Wants to know if you will call something in for her to [place] [facility].  States she has been sick since Sunday, when she returned from her trip (perhaps getting sick from the change in climate from hot to cold??).    Pharmacy:  [facility] phone # for contact:  [phone]

## 2015-12-29 NOTE — Telephone Encounter (Signed)
Call in Zithromax Z pak 2 po day 1 followed by one po days 2-5. See next week if not better.

## 2015-12-29 NOTE — Telephone Encounter (Signed)
Zpak sent to [facility], patient aware.

## 2016-01-01 ENCOUNTER — Ambulatory Visit (INDEPENDENT_AMBULATORY_CARE_PROVIDER_SITE_OTHER): Payer: BC Managed Care – PPO | Admitting: Podiatry

## 2016-01-01 DIAGNOSIS — M7661 Achilles tendinitis, right leg: Secondary | ICD-10-CM

## 2016-01-01 DIAGNOSIS — D169 Benign neoplasm of bone and articular cartilage, unspecified: Secondary | ICD-10-CM | POA: Diagnosis not present

## 2016-01-01 NOTE — Progress Notes (Signed)
Subjective:     Patient ID: Brittany Bender, female   DOB: [birth year], 65 y.o.   MRN: [id number]  HPI patient presents stating that her heel is somewhat better than previously and she still has trouble with the top of her right foot with pain when palpated   Review of Systems     Objective:   Physical Exam Neurovascular status intact negative Homans sign noted with diminished discomfort posterior aspect right heel with pain that still present but significant improvement from previous with patient noted to having spurring of the midtarsal joint right foot that's painful when pressed    Assessment:     Dorsal exostosis right midtarsal joint with Achilles tendinitis improved but present    Plan:     Reviewed both conditions and we will consider shockwave therapy of the right foot remains symptomatic in the heel. She wants the spur taken off the right dorsal foot and wants to wait and do this in the next month but at this point she wants to do consent forms of she does not have another copayment. I allowed her to read a consent form reviewing removal of spur dorsal right explaining to her risk chances for nonimprovement and all complications and alternative treatments as listed. She is willing to accept risk signs consent form and is scheduled for outpatient surgery with all questions answered at this time

## 2016-04-05 ENCOUNTER — Encounter: Payer: BC Managed Care – PPO | Admitting: Obstetrics & Gynecology

## 2016-05-20 ENCOUNTER — Telehealth (INDEPENDENT_AMBULATORY_CARE_PROVIDER_SITE_OTHER): Payer: BC Managed Care – PPO | Admitting: Internal Medicine

## 2016-05-20 DIAGNOSIS — G4733 Obstructive sleep apnea (adult) (pediatric): Secondary | ICD-10-CM

## 2016-05-20 NOTE — Telephone Encounter (Signed)
Patient calling to request a Rx for CPAP supplies.  [facility] stated that it could be sent via EPIC.  They are on EPIC.  Patient provided this information.  Patient is coming to see you on 5/18 for a 6 month visit with 6 month labs as well.    Please advise.    Best number to contact patient is:  [phone].

## 2016-05-20 NOTE — Telephone Encounter (Signed)
Ordered CPAP mask but supplies is NOT an option

## 2016-06-08 ENCOUNTER — Other Ambulatory Visit: Payer: Self-pay | Admitting: Internal Medicine

## 2016-06-13 ENCOUNTER — Other Ambulatory Visit: Payer: BC Managed Care – PPO | Admitting: Internal Medicine

## 2016-06-13 DIAGNOSIS — E785 Hyperlipidemia, unspecified: Secondary | ICD-10-CM

## 2016-06-13 DIAGNOSIS — R7302 Impaired glucose tolerance (oral): Secondary | ICD-10-CM

## 2016-06-13 LAB — LIPID PANEL
CHOLESTEROL: 232 mg/dL — AB (ref <200)
HDL: 75 mg/dL (ref >50)
LDL Cholesterol: 142 mg/dL — ABNORMAL HIGH (ref <100)
TRIGLYCERIDES: 77 mg/dL (ref <150)
Total CHOL/HDL Ratio: 3.1 Ratio (ref <5.0)
VLDL: 15 mg/dL (ref <30)

## 2016-06-13 LAB — CBC
HCT: 36.6 % (ref 35.0–45.0)
HEMOGLOBIN: 11.9 g/dL (ref 11.7–15.5)
MCH: 29.8 pg (ref 27.0–33.0)
MCHC: 32.5 g/dL (ref 32.0–36.0)
MCV: 91.5 fL (ref 80.0–100.0)
MPV: 9.3 fL (ref 7.5–12.5)
Platelets: 261 10*3/uL (ref 140–400)
RBC: 4 MIL/uL (ref 3.80–5.10)
RDW: 15.7 % — ABNORMAL HIGH (ref 11.0–15.0)
WBC: 3.2 10*3/uL — AB (ref 3.8–10.8)

## 2016-06-14 ENCOUNTER — Encounter: Payer: Self-pay | Admitting: Internal Medicine

## 2016-06-14 ENCOUNTER — Ambulatory Visit (INDEPENDENT_AMBULATORY_CARE_PROVIDER_SITE_OTHER): Payer: BC Managed Care – PPO | Admitting: Internal Medicine

## 2016-06-14 VITALS — BP 122/74 | HR 84 | Temp 98.7°F | Ht 65.0 in | Wt 250.0 lb

## 2016-06-14 DIAGNOSIS — I519 Heart disease, unspecified: Secondary | ICD-10-CM | POA: Diagnosis not present

## 2016-06-14 DIAGNOSIS — Z853 Personal history of malignant neoplasm of breast: Secondary | ICD-10-CM | POA: Diagnosis not present

## 2016-06-14 DIAGNOSIS — G4733 Obstructive sleep apnea (adult) (pediatric): Secondary | ICD-10-CM

## 2016-06-14 DIAGNOSIS — Z87898 Personal history of other specified conditions: Secondary | ICD-10-CM

## 2016-06-14 DIAGNOSIS — I5189 Other ill-defined heart diseases: Secondary | ICD-10-CM

## 2016-06-14 LAB — HEMOGLOBIN A1C
Hgb A1c MFr Bld: 5.8 % — ABNORMAL HIGH (ref <5.7)
Mean Plasma Glucose: 120 mg/dL

## 2016-06-14 MED ORDER — LEVOCETIRIZINE DIHYDROCHLORIDE 5 MG PO TABS: 5.0000 mg | ORAL_TABLET | Freq: Every day | ORAL | 3 refills | Status: DC | PRN

## 2016-06-14 MED ORDER — METOPROLOL TARTRATE 25 MG PO TABS: 25.0000 mg | ORAL_TABLET | Freq: Two times a day (BID) | ORAL | 1 refills | Status: DC

## 2016-06-14 MED ORDER — ALPRAZOLAM 0.5 MG PO TABS: 0.5000 mg | ORAL_TABLET | Freq: Every evening | ORAL | 1 refills | Status: DC | PRN

## 2016-06-14 MED ORDER — MONTELUKAST SODIUM 10 MG PO TABS: 10.0000 mg | ORAL_TABLET | ORAL | 3 refills | Status: DC | PRN

## 2016-06-14 NOTE — Progress Notes (Signed)
   Subjective:    Patient ID: Brittany Bender, female    DOB: 09/18/1950, 66 y.o.   MRN: [id number]  HPI  66 year old Female for six-month follow-up on tachycardia,  insomnia and sleep apnea. Needs new prescription sent to [facility] for C Pap supplies. This will be done via mail.  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 Looking forward to vacation. Blood pressure under good control.  Only sleeps about 4 hours with Xanax. Asking about larger dose. Tends to wake up about 1 AM and she will take her Xanax at that point in time. I think if she took a larger dose she would get up at 5:30 without being drowsy. I think she has no issues going to sleep around 9 PM and she should wait and take her Xanax at 1 AM when she wakes up in the same dose given the fact that it usually last about 4 hours.    Review of Systems see above     Objective:   Physical Exam  Skin warm and dry. Nodes none. Chest clear to auscultation. Neck supple without JVD or thyromegaly. Cardiac exam regular rate and rhythm normal S1 and S2. Extremities without edema      Assessment & Plan:  History of tachycardia controlled with beta blocker  Insomnia  Obesity  Sleep apnea  History of breast cancer  Plan: Continue same prescription for insomnia. Blood pressure stable and under good control and we'll continue same regimen. CPE due in 6 months. Prescription for C Pap supplies mailed to [facility] on [address]. Refill metoprolol for tachycardia. Follow-up in 6 months which will be time for physical examination.

## 2016-06-16 ENCOUNTER — Encounter: Payer: Self-pay | Admitting: Internal Medicine

## 2016-06-16 NOTE — Patient Instructions (Signed)
It was pleasure to see you today. Continue same medications. Prescription for sleep apnea supplies faxed to [facility]. Physical exam due in 6 months.

## 2016-07-16 ENCOUNTER — Telehealth: Payer: Self-pay | Admitting: Internal Medicine

## 2016-07-16 NOTE — Telephone Encounter (Signed)
Addendum written and order re-sent

## 2016-07-16 NOTE — Telephone Encounter (Signed)
Faxed updated Rx with amended note by Dr. Renold Genta to [facility] at [phone].

## 2016-07-16 NOTE — Telephone Encounter (Signed)
[name] is calling regarding patients CPAP face to face note from 06/14/16.  She states that the note needs to be amended to say that she's compliant and benefiting from CPAP therapy.  Please fax this amended note to [facility] at [phone].  Raven states that she is fine with the CMA doing this for her.    Thank you.

## 2016-07-22 ENCOUNTER — Telehealth: Payer: Self-pay | Admitting: Internal Medicine

## 2016-07-22 NOTE — Telephone Encounter (Signed)
Raven from [facility] calling on patient.  Was needing original chart note addendum done, not the Rx.  States that she cannot use the Rx that was sent with the note on it.  Advised that we have sent that with the added note on it.  We signed order for patient this morning and sent them over.  Advised that our physician's time is valuable.  We have done this as they requested.  I am printing out DOS 06/14/16 and patient's previous CPE, adding the notation of "patient is compliant and benefiting from CPAP therapy."  I will ask Dr. Renold Genta to sign this addendum ONCE more and this will be our last time to fax this over to [facility].    Raven then told me that she would just put a note in the chart that said that Dr. Renold Genta wouldn't send them anything more and deny it.  I spoke with Dr. Renold Genta and she phone the young lady and spoke with her.    I have faxed the notes to [facility] at [phone].

## 2016-08-26 ENCOUNTER — Encounter: Payer: Self-pay | Admitting: Gastroenterology

## 2016-08-26 ENCOUNTER — Other Ambulatory Visit: Payer: Self-pay | Admitting: Internal Medicine

## 2016-08-26 DIAGNOSIS — Z1231 Encounter for screening mammogram for malignant neoplasm of breast: Secondary | ICD-10-CM

## 2016-09-09 ENCOUNTER — Ambulatory Visit
Admission: RE | Admit: 2016-09-09 | Discharge: 2016-09-09 | Disposition: A | Discharge status: Home / Self Care | Payer: BC Managed Care – PPO | Source: Ambulatory Visit | Attending: Internal Medicine | Admitting: Internal Medicine

## 2016-09-09 DIAGNOSIS — Z1231 Encounter for screening mammogram for malignant neoplasm of breast: Secondary | ICD-10-CM

## 2016-09-09 HISTORY — DX: Personal history of irradiation: Z92.3

## 2016-10-21 ENCOUNTER — Ambulatory Visit (AMBULATORY_SURGERY_CENTER): Payer: BC Managed Care – PPO | Admitting: *Deleted

## 2016-10-21 VITALS — Ht 66.0 in | Wt 260.0 lb

## 2016-10-21 DIAGNOSIS — Z1211 Encounter for screening for malignant neoplasm of colon: Secondary | ICD-10-CM

## 2016-10-21 MED ORDER — NA SULFATE-K SULFATE-MG SULF 17.5-3.13-1.6 GM/177ML PO SOLN: 1.0000 [IU] | Freq: Once | ORAL | 0 refills | Status: AC

## 2016-10-21 NOTE — Progress Notes (Signed)
No egg or soy allergy known to patient  No issues with past sedation with any surgeries  or procedures, no intubation problems  Some nausea No diet pills per patient No home 02 use per patient  No blood thinners per patient  Pt denies issues with constipation  No A fib or A flutter  EMMI video sent to pt's e mail

## 2016-10-22 ENCOUNTER — Encounter: Payer: Self-pay | Admitting: Gastroenterology

## 2016-11-04 ENCOUNTER — Ambulatory Visit (AMBULATORY_SURGERY_CENTER): Payer: BC Managed Care – PPO | Admitting: Gastroenterology

## 2016-11-04 ENCOUNTER — Encounter: Payer: Self-pay | Admitting: Gastroenterology

## 2016-11-04 VITALS — BP 117/62 | HR 73 | Temp 98.2°F | Resp 10 | Ht 66.0 in | Wt 260.0 lb

## 2016-11-04 DIAGNOSIS — Z1212 Encounter for screening for malignant neoplasm of rectum: Secondary | ICD-10-CM

## 2016-11-04 DIAGNOSIS — Z1211 Encounter for screening for malignant neoplasm of colon: Secondary | ICD-10-CM | POA: Diagnosis not present

## 2016-11-04 MED ORDER — SODIUM CHLORIDE 0.9 % IV SOLN: 500.0000 mL | INTRAVENOUS | Status: DC

## 2016-11-04 NOTE — Patient Instructions (Signed)
Discharge instructions given. Handout on diverticulosis. Resume previous medications. YOU HAD AN ENDOSCOPIC PROCEDURE TODAY AT THE [facility]:   Refer to the procedure report that was given to you for any specific questions about what was found during the examination.  If the procedure report does not answer your questions, please call your gastroenterologist to clarify.  If you requested that your care partner not be given the details of your procedure findings, then the procedure report has been included in a sealed envelope for you to review at your convenience later.  YOU SHOULD EXPECT: Some feelings of bloating in the abdomen. Passage of more gas than usual.  Walking can help get rid of the air that was put into your GI tract during the procedure and reduce the bloating. If you had a lower endoscopy (such as a colonoscopy or flexible sigmoidoscopy) you may notice spotting of blood in your stool or on the toilet paper. If you underwent a bowel prep for your procedure, you may not have a normal bowel movement for a few days.  Please Note:  You might notice some irritation and congestion in your nose or some drainage.  This is from the oxygen used during your procedure.  There is no need for concern and it should clear up in a day or so.  SYMPTOMS TO REPORT IMMEDIATELY:   Following lower endoscopy (colonoscopy or flexible sigmoidoscopy):  Excessive amounts of blood in the stool  Significant tenderness or worsening of abdominal pains  Swelling of the abdomen that is new, acute  Fever of 100F or higher   For urgent or emergent issues, a gastroenterologist can be reached at any hour by calling [phone].   DIET:  We do recommend a small meal at first, but then you may proceed to your regular diet.  Drink plenty of fluids but you should avoid alcoholic beverages for 24 hours.  ACTIVITY:  You should plan to take it easy for the rest of today and you should NOT DRIVE or use  heavy machinery until tomorrow (because of the sedation medicines used during the test).    FOLLOW UP: Our staff will call the number listed on your records the next business day following your procedure to check on you and address any questions or concerns that you may have regarding the information given to you following your procedure. If we do not reach you, we will leave a message.  However, if you are feeling well and you are not experiencing any problems, there is no need to return our call.  We will assume that you have returned to your regular daily activities without incident.  If any biopsies were taken you will be contacted by phone or by letter within the next 1-3 weeks.  Please call us at [phone] if you have not heard about the biopsies in 3 weeks.    SIGNATURES/CONFIDENTIALITY: You and/or your care partner have signed paperwork which will be entered into your electronic medical record.  These signatures attest to the fact that that the information above on your After Visit Summary has been reviewed and is understood.  Full responsibility of the confidentiality of this discharge information lies with you and/or your care-partner. 

## 2016-11-04 NOTE — Op Note (Signed)
[facility] Patient Name: Brittany Bender Procedure Date: 11/04/2016 10:02 AM MRN: [id number] Endoscopist: Mallie Mussel L. Loletha Carrow , MD Age: 66 Referring MD:  Date of Birth: 04-18-1950 Gender: Female Account #: [id number] Procedure:                Colonoscopy Indications:              Screening for colorectal malignant neoplasm (no                            polyps on 03/2006 colonoscopy) Medicines:                Monitored Anesthesia Care Procedure:                Pre-Anesthesia Assessment:                           - Prior to the procedure, a History and Physical                            was performed, and patient medications and                            allergies were reviewed. The patient's tolerance of                            previous anesthesia was also reviewed. The risks                            and benefits of the procedure and the sedation                            options and risks were discussed with the patient.                            All questions were answered, and informed consent                            was obtained. Prior Anticoagulants: The patient has                            taken no previous anticoagulant or antiplatelet                            agents. ASA Grade Assessment: II - A patient with                            mild systemic disease. After reviewing the risks                            and benefits, the patient was deemed in                            satisfactory condition to undergo the procedure.  After obtaining informed consent, the colonoscope                            was passed under direct vision. Throughout the                            procedure, the patient's blood pressure, pulse, and                            oxygen saturations were monitored continuously. The                            Model PCF-H190DL [id number]) scope was introduced                            through the anus and advanced  to the the cecum,                            identified by appendiceal orifice and ileocecal                            valve. The colonoscopy was performed without                            difficulty. The patient tolerated the procedure                            well. The quality of the bowel preparation was                            excellent. The ileocecal valve, appendiceal                            orifice, and rectum were photographed. The quality                            of the bowel preparation was evaluated using the                            BBPS [facility] Bowel Preparation Scale) with scores                            of: Right Colon = 3, Transverse Colon = 3 and Left                            Colon = 3 (entire mucosa seen well with no residual                            staining, small fragments of stool or opaque                            liquid). The total BBPS score equals 9. The bowel  preparation used was SUPREP. Scope In: 10:09:57 AM Scope Out: 10:21:00 AM Scope Withdrawal Time: 0 hours 8 minutes 9 seconds  Total Procedure Duration: 0 hours 11 minutes 3 seconds  Findings:                 The digital rectal exam findings include decreased                            sphincter tone.                           Diverticula were found in the left colon.                           The exam was otherwise without abnormality on                            direct and retroflexion views. Complications:            No immediate complications. Estimated Blood Loss:     Estimated blood loss: none. Impression:               - Decreased sphincter tone found on digital rectal                            exam.                           - Diverticulosis in the left colon.                           - The examination was otherwise normal on direct                            and retroflexion views.                           - No specimens  collected. Recommendation:           - Patient has a contact number available for                            emergencies. The signs and symptoms of potential                            delayed complications were discussed with the                            patient. Return to normal activities tomorrow.                            Written discharge instructions were provided to the                            patient.                           - Resume previous diet.                           -  Continue present medications.                           - Repeat colonoscopy in 10 years for screening                            purposes. Corbin Hott L. Loletha Carrow, MD 11/04/2016 10:24:37 AM This report has been signed electronically.

## 2016-11-04 NOTE — Progress Notes (Signed)
Pt's states no medical or surgical changes since previsit or office visit. 

## 2016-11-04 NOTE — Progress Notes (Signed)
Report given to PACU, vss 

## 2016-11-05 ENCOUNTER — Telehealth: Payer: Self-pay | Admitting: *Deleted

## 2016-11-05 NOTE — Telephone Encounter (Signed)
  Follow up Call-  Call back number 11/04/2016  Post procedure Call Back phone  # [phone]  Permission to leave phone message Yes  Some recent data might be hidden     Patient questions:  Do you have a fever, pain , or abdominal swelling? No. Pain Score  0 *  Have you tolerated food without any problems? Yes.    Have you been able to return to your normal activities? Yes.    Do you have any questions about your discharge instructions: Diet   No. Medications  No. Follow up visit  No.  Do you have questions or concerns about your Care? No.  Actions: * If pain score is 4 or above: No action needed, pain <4.

## 2016-11-13 ENCOUNTER — Ambulatory Visit (INDEPENDENT_AMBULATORY_CARE_PROVIDER_SITE_OTHER): Payer: BC Managed Care – PPO | Admitting: Internal Medicine

## 2016-11-13 DIAGNOSIS — Z23 Encounter for immunization: Secondary | ICD-10-CM

## 2016-11-13 NOTE — Patient Instructions (Signed)
Flu shot given

## 2016-11-13 NOTE — Progress Notes (Signed)
Flu shot given

## 2016-11-19 ENCOUNTER — Emergency Department (HOSPITAL_COMMUNITY): Payer: BC Managed Care – PPO

## 2016-11-19 ENCOUNTER — Encounter (HOSPITAL_COMMUNITY): Payer: Self-pay | Admitting: Emergency Medicine

## 2016-11-19 ENCOUNTER — Emergency Department (HOSPITAL_COMMUNITY)
Admission: EM | Admit: 2016-11-19 | Discharge: 2016-11-20 | Disposition: A | Discharge status: Home / Self Care | Payer: BC Managed Care – PPO | Attending: Emergency Medicine | Admitting: Emergency Medicine

## 2016-11-19 DIAGNOSIS — M542 Cervicalgia: Secondary | ICD-10-CM

## 2016-11-19 DIAGNOSIS — Y92410 Unspecified street and highway as the place of occurrence of the external cause: Secondary | ICD-10-CM | POA: Diagnosis not present

## 2016-11-19 DIAGNOSIS — Y999 Unspecified external cause status: Secondary | ICD-10-CM | POA: Insufficient documentation

## 2016-11-19 DIAGNOSIS — R0789 Other chest pain: Secondary | ICD-10-CM | POA: Diagnosis not present

## 2016-11-19 DIAGNOSIS — I110 Hypertensive heart disease with heart failure: Secondary | ICD-10-CM | POA: Diagnosis not present

## 2016-11-19 DIAGNOSIS — S99921A Unspecified injury of right foot, initial encounter: Secondary | ICD-10-CM | POA: Diagnosis present

## 2016-11-19 DIAGNOSIS — Z853 Personal history of malignant neoplasm of breast: Secondary | ICD-10-CM | POA: Insufficient documentation

## 2016-11-19 DIAGNOSIS — J45909 Unspecified asthma, uncomplicated: Secondary | ICD-10-CM | POA: Insufficient documentation

## 2016-11-19 DIAGNOSIS — S93304A Unspecified dislocation of right foot, initial encounter: Secondary | ICD-10-CM | POA: Insufficient documentation

## 2016-11-19 DIAGNOSIS — M25551 Pain in right hip: Secondary | ICD-10-CM | POA: Diagnosis not present

## 2016-11-19 DIAGNOSIS — M25562 Pain in left knee: Secondary | ICD-10-CM | POA: Diagnosis not present

## 2016-11-19 DIAGNOSIS — I5030 Unspecified diastolic (congestive) heart failure: Secondary | ICD-10-CM | POA: Insufficient documentation

## 2016-11-19 DIAGNOSIS — S93306A Unspecified dislocation of unspecified foot, initial encounter: Secondary | ICD-10-CM

## 2016-11-19 DIAGNOSIS — M25561 Pain in right knee: Secondary | ICD-10-CM | POA: Diagnosis not present

## 2016-11-19 DIAGNOSIS — Y9389 Activity, other specified: Secondary | ICD-10-CM | POA: Diagnosis not present

## 2016-11-19 DIAGNOSIS — Z79899 Other long term (current) drug therapy: Secondary | ICD-10-CM | POA: Insufficient documentation

## 2016-11-19 LAB — COMPREHENSIVE METABOLIC PANEL
ALK PHOS: 66 U/L (ref 38–126)
ALT: 11 U/L — ABNORMAL LOW (ref 14–54)
ANION GAP: 12 (ref 5–15)
AST: 19 U/L (ref 15–41)
Albumin: 3.8 g/dL (ref 3.5–5.0)
BILIRUBIN TOTAL: 0.4 mg/dL (ref 0.3–1.2)
BUN: 11 mg/dL (ref 6–20)
CALCIUM: 9.3 mg/dL (ref 8.9–10.3)
CO2: 20 mmol/L — ABNORMAL LOW (ref 22–32)
Chloride: 105 mmol/L (ref 101–111)
Creatinine, Ser: 0.68 mg/dL (ref 0.44–1.00)
GFR calc non Af Amer: >60 mL/min (ref >60)
Glucose, Bld: 149 mg/dL — ABNORMAL HIGH (ref 65–99)
POTASSIUM: 3.4 mmol/L — AB (ref 3.5–5.1)
SODIUM: 137 mmol/L (ref 135–145)
TOTAL PROTEIN: 7.5 g/dL (ref 6.5–8.1)

## 2016-11-19 LAB — CBC WITH DIFFERENTIAL/PLATELET
BASOS ABS: 0 10*3/uL (ref 0.0–0.1)
BASOS PCT: 0 %
Eosinophils Absolute: 0.1 10*3/uL (ref 0.0–0.7)
Eosinophils Relative: 1 %
HEMATOCRIT: 34.4 % — AB (ref 36.0–46.0)
HEMOGLOBIN: 11.4 g/dL — AB (ref 12.0–15.0)
LYMPHS PCT: 16 %
Lymphs Abs: 1.7 10*3/uL (ref 0.7–4.0)
MCH: 30.2 pg (ref 26.0–34.0)
MCHC: 33.1 g/dL (ref 30.0–36.0)
MCV: 91 fL (ref 78.0–100.0)
Monocytes Absolute: 0.8 10*3/uL (ref 0.1–1.0)
Monocytes Relative: 7 %
NEUTROS ABS: 8.4 10*3/uL — AB (ref 1.7–7.7)
NEUTROS PCT: 76 %
Platelets: 202 10*3/uL (ref 150–400)
RBC: 3.78 MIL/uL — AB (ref 3.87–5.11)
RDW: 14.8 % (ref 11.5–15.5)
WBC: 10.9 10*3/uL — ABNORMAL HIGH (ref 4.0–10.5)

## 2016-11-19 MED ORDER — FENTANYL CITRATE (PF) 100 MCG/2ML IJ SOLN
50.0000 ug | Freq: Once | INTRAMUSCULAR | Status: AC
  Administered 2016-11-19: 50 ug via INTRAVENOUS
  Filled 2016-11-19: qty 2

## 2016-11-19 MED ORDER — IOPAMIDOL (ISOVUE-300) INJECTION 61%
INTRAVENOUS | Status: AC
  Administered 2016-11-19: 100 mL
  Filled 2016-11-19: qty 100

## 2016-11-19 MED ORDER — HYDROMORPHONE HCL 1 MG/ML IJ SOLN
1.0000 mg | Freq: Once | INTRAMUSCULAR | Status: AC
  Administered 2016-11-19: 1 mg via INTRAVENOUS

## 2016-11-19 MED ORDER — HYDROMORPHONE HCL 1 MG/ML IJ SOLN
0.5000 mg | Freq: Once | INTRAMUSCULAR | Status: DC
  Filled 2016-11-19: qty 1
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
## 2016-11-19 NOTE — Progress Notes (Signed)
Orthopedic Tech Progress Note Patient Details:  Brittany Bender 11-14-1950 [id number]  Ortho Devices Type of Ortho Device: Short leg splint Ortho Device/Splint Location: right short let splint to pt right ankle.    Ortho Device/Splint Interventions: Application   Kristopher Oppenheim 11/19/2016, 10:11 PM

## 2016-11-19 NOTE — ED Notes (Signed)
Aspen collar placed on pt, per [place] - [place].
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
## 2016-11-19 NOTE — ED Notes (Signed)
Restrictions band (HOT PINK) placed on pt's right wrist.

## 2016-11-20 ENCOUNTER — Emergency Department (HOSPITAL_COMMUNITY): Payer: BC Managed Care – PPO

## 2016-11-20 DIAGNOSIS — S93304A Unspecified dislocation of right foot, initial encounter: Secondary | ICD-10-CM | POA: Diagnosis not present

## 2016-11-20 LAB — URINALYSIS, ROUTINE W REFLEX MICROSCOPIC
BILIRUBIN URINE: NEGATIVE
Glucose, UA: NEGATIVE mg/dL
Hgb urine dipstick: NEGATIVE
KETONES UR: NEGATIVE mg/dL
Leukocytes, UA: NEGATIVE
Nitrite: POSITIVE — AB
Protein, ur: NEGATIVE mg/dL
Specific Gravity, Urine: >1.046 — ABNORMAL HIGH (ref 1.005–1.030)
pH: 5 (ref 5.0–8.0)

## 2016-11-20 MED ORDER — PROPOFOL 10 MG/ML IV BOLUS
INTRAVENOUS | Status: AC | PRN
  Administered 2016-11-20: 75 mg via INTRAVENOUS

## 2016-11-20 MED ORDER — KETAMINE HCL 10 MG/ML IJ SOLN
INTRAMUSCULAR | Status: AC | PRN
  Administered 2016-11-20: 75 mg via INTRAVENOUS

## 2016-11-20 MED ORDER — ONDANSETRON HCL 4 MG/2ML IJ SOLN
INTRAMUSCULAR | Status: AC
  Filled 2016-11-20: qty 2

## 2016-11-20 MED ORDER — OXYCODONE-ACETAMINOPHEN 5-325 MG PO TABS: 1.0000 | ORAL_TABLET | Freq: Four times a day (QID) | ORAL | 0 refills | Status: DC | PRN

## 2016-11-20 MED ORDER — KETAMINE HCL-SODIUM CHLORIDE 100-0.9 MG/10ML-% IV SOSY
0.5000 mg/kg | PREFILLED_SYRINGE | Freq: Once | INTRAVENOUS | Status: DC
  Filled 2016-11-20: qty 10

## 2016-11-20 MED ORDER — PROPOFOL 10 MG/ML IV BOLUS
0.5000 mg/kg | Freq: Once | INTRAVENOUS | Status: DC
  Filled 2016-11-20: qty 20
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
## 2016-11-20 NOTE — Sedation Documentation (Addendum)
PA reducing R foot. RT assisting ventilations with BVM at 15Lpm after pt began to desat and have reduced respirations.

## 2016-11-20 NOTE — ED Notes (Signed)
STAT PORTABLE ANKLE XRAY REQUESTED

## 2016-11-20 NOTE — Discharge Instructions (Addendum)
Please read instructions below. Apply ice to your foot and other body aches for 20 minutes at a time. Do not bear any weight on your right foot, and elevate it as much as possible as you will most likely experience lots of swelling. You can take 1-2 tabs of oxycodone every 6 hours as needed for severe pain. Do not take Tylenol, drive, or drink alcohol while taking this medication. You can take Advil/ibuprofen every 6 hours with oxycodone or by itself for pain.  You will experience muscle soreness the next few days and this is normal. Drink plenty of water. Schedule an appointment with the orthopedic specialist, Dr. Ninfa Linden, in 3 days for repeat x-ray and follow-up on your injury. Follow-up with your primary care regarding the arthritic changes shown in your other x-rays today. Return to ER if new numbness or tingling in your arms or legs, inability to urinate, inability to hold your bowels, weakness in your extremities, severe headache, vision changes, or new or concerning symptoms.

## 2016-11-25 ENCOUNTER — Ambulatory Visit (INDEPENDENT_AMBULATORY_CARE_PROVIDER_SITE_OTHER): Payer: Self-pay

## 2016-11-25 ENCOUNTER — Ambulatory Visit (INDEPENDENT_AMBULATORY_CARE_PROVIDER_SITE_OTHER): Payer: Self-pay | Admitting: Orthopaedic Surgery

## 2016-11-25 DIAGNOSIS — S93314A Dislocation of tarsal joint of right foot, initial encounter: Secondary | ICD-10-CM

## 2016-11-25 DIAGNOSIS — M25571 Pain in right ankle and joints of right foot: Secondary | ICD-10-CM

## 2016-11-25 MED ORDER — OXYCODONE-ACETAMINOPHEN 5-325 MG PO TABS: 1.0000 | ORAL_TABLET | Freq: Four times a day (QID) | ORAL | 0 refills | Status: DC | PRN
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
## 2016-12-09 ENCOUNTER — Encounter (INDEPENDENT_AMBULATORY_CARE_PROVIDER_SITE_OTHER): Payer: Self-pay | Admitting: Orthopaedic Surgery

## 2016-12-09 ENCOUNTER — Ambulatory Visit (INDEPENDENT_AMBULATORY_CARE_PROVIDER_SITE_OTHER): Payer: Self-pay

## 2016-12-09 ENCOUNTER — Ambulatory Visit (INDEPENDENT_AMBULATORY_CARE_PROVIDER_SITE_OTHER): Payer: BC Managed Care – PPO | Admitting: Orthopaedic Surgery

## 2016-12-09 DIAGNOSIS — S93314D Dislocation of tarsal joint of right foot, subsequent encounter: Secondary | ICD-10-CM

## 2016-12-09 DIAGNOSIS — M79671 Pain in right foot: Secondary | ICD-10-CM

## 2016-12-09 DIAGNOSIS — S93314A Dislocation of tarsal joint of right foot, initial encounter: Secondary | ICD-10-CM | POA: Insufficient documentation
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
## 2016-12-14 ENCOUNTER — Other Ambulatory Visit: Payer: Self-pay | Admitting: Internal Medicine

## 2016-12-14 NOTE — Telephone Encounter (Signed)
Refill x 6 months 

## 2016-12-23 ENCOUNTER — Ambulatory Visit (INDEPENDENT_AMBULATORY_CARE_PROVIDER_SITE_OTHER): Payer: BC Managed Care – PPO | Admitting: Orthopaedic Surgery

## 2016-12-23 ENCOUNTER — Encounter (INDEPENDENT_AMBULATORY_CARE_PROVIDER_SITE_OTHER): Payer: Self-pay | Admitting: Orthopaedic Surgery

## 2016-12-23 DIAGNOSIS — S93314D Dislocation of tarsal joint of right foot, subsequent encounter: Secondary | ICD-10-CM
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
## 2017-01-15 ENCOUNTER — Ambulatory Visit (INDEPENDENT_AMBULATORY_CARE_PROVIDER_SITE_OTHER): Payer: BC Managed Care – PPO | Admitting: Orthopaedic Surgery

## 2017-01-15 ENCOUNTER — Encounter (INDEPENDENT_AMBULATORY_CARE_PROVIDER_SITE_OTHER): Payer: Self-pay | Admitting: Orthopaedic Surgery

## 2017-01-15 DIAGNOSIS — S93314D Dislocation of tarsal joint of right foot, subsequent encounter: Secondary | ICD-10-CM

## 2017-01-15 DIAGNOSIS — M79671 Pain in right foot: Secondary | ICD-10-CM
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
## 2017-01-28 HISTORY — PX: BREAST BIOPSY: SHX20

## 2017-01-29 ENCOUNTER — Ambulatory Visit (INDEPENDENT_AMBULATORY_CARE_PROVIDER_SITE_OTHER): Payer: BC Managed Care – PPO | Admitting: Physician Assistant

## 2017-01-29 ENCOUNTER — Encounter (INDEPENDENT_AMBULATORY_CARE_PROVIDER_SITE_OTHER): Payer: Self-pay | Admitting: Physician Assistant

## 2017-01-29 DIAGNOSIS — S93314D Dislocation of tarsal joint of right foot, subsequent encounter: Secondary | ICD-10-CM | POA: Diagnosis not present
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
## 2017-02-26 ENCOUNTER — Ambulatory Visit (INDEPENDENT_AMBULATORY_CARE_PROVIDER_SITE_OTHER): Payer: BC Managed Care – PPO | Admitting: Orthopaedic Surgery

## 2017-02-27 ENCOUNTER — Other Ambulatory Visit (INDEPENDENT_AMBULATORY_CARE_PROVIDER_SITE_OTHER): Payer: Self-pay

## 2017-02-27 ENCOUNTER — Encounter (INDEPENDENT_AMBULATORY_CARE_PROVIDER_SITE_OTHER): Payer: Self-pay | Admitting: Physician Assistant

## 2017-02-27 ENCOUNTER — Ambulatory Visit (INDEPENDENT_AMBULATORY_CARE_PROVIDER_SITE_OTHER): Payer: BC Managed Care – PPO | Admitting: Physician Assistant

## 2017-02-27 DIAGNOSIS — S93314D Dislocation of tarsal joint of right foot, subsequent encounter: Secondary | ICD-10-CM

## 2017-02-27 MED ORDER — IBUPROFEN 800 MG PO TABS: 800.0000 mg | ORAL_TABLET | Freq: Three times a day (TID) | ORAL | 1 refills | Status: DC | PRN
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
## 2017-03-23 ENCOUNTER — Other Ambulatory Visit: Payer: Self-pay | Admitting: Internal Medicine

## 2017-03-24 ENCOUNTER — Ambulatory Visit (INDEPENDENT_AMBULATORY_CARE_PROVIDER_SITE_OTHER): Payer: BC Managed Care – PPO

## 2017-03-24 ENCOUNTER — Encounter (INDEPENDENT_AMBULATORY_CARE_PROVIDER_SITE_OTHER): Payer: Self-pay | Admitting: Physician Assistant

## 2017-03-24 ENCOUNTER — Ambulatory Visit (INDEPENDENT_AMBULATORY_CARE_PROVIDER_SITE_OTHER): Payer: BC Managed Care – PPO | Admitting: Physician Assistant

## 2017-03-24 DIAGNOSIS — S93314D Dislocation of tarsal joint of right foot, subsequent encounter: Secondary | ICD-10-CM | POA: Diagnosis not present

## 2017-03-24 NOTE — Progress Notes (Signed)
HPI: Ms. Detjen returns today follow-up of her right ankle dislocation.  She states overall that her ankle is improving but she still having difficulty if she drives for a long period of time she tried to drive [place] yesterday had a significant amount of pain with was able to make the trip though.  She notes some swelling of the ankle.  She is wearing the ASO brace.  She has 5 out of 10 pain at worst.  No true mechanical symptoms of the ankle just some stiffness.  She is wanting to return to work on March 13.  Physical exam right ankle: Good dorsiflexion plantarflexion.  She has tenderness of the anterior aspect of the ankle and also at the Achilles insertion.  No tenderness over the medial lateral malleolus.  Mild edema.  No erythema.   Right ankle: 3 views show the ankle to be well located within the ankle mortise no diastases.  Os trigonum, Haglund's deformity and heel spur all present.  No other bony abnormalities.   Impression: Status post right ankle traumatic subtalar dislocation  Plan: At this point, we will have her go to physical therapy to work on range of motion strengthening wean out of the ASO his proprioception and strength improved.  She will follow-up with [place] in 4 weeks check progress lack of.  Did place her back to work on March 13 with the need for accommodation to elevate the foot periodically throughout the day.  Did speak with her about the possible intra-articular injection of the right ankle she defers.
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
## 2017-04-02 NOTE — Progress Notes (Signed)
CPE Labs booked for 5/6 CPE booked for 5/7

## 2017-04-10 ENCOUNTER — Telehealth (INDEPENDENT_AMBULATORY_CARE_PROVIDER_SITE_OTHER): Payer: Self-pay

## 2017-04-10 NOTE — Telephone Encounter (Signed)
Talked with patient and she stated that she would try some other alternatives.

## 2017-04-10 NOTE — Telephone Encounter (Signed)
Can you work her in with gil next week please  No time today

## 2017-04-10 NOTE — Telephone Encounter (Signed)
Patient would like to know if she can be worked in this afternoon for right knee pain.  Stated that she has been having knee pain for 5 days.  Right knee has been aching and buckles while she is walking.  Cb # is [phone].  Please leave message if no answer. Thank you.

## 2017-05-05 ENCOUNTER — Ambulatory Visit (INDEPENDENT_AMBULATORY_CARE_PROVIDER_SITE_OTHER): Payer: BC Managed Care – PPO | Admitting: Physician Assistant

## 2017-05-05 ENCOUNTER — Encounter (INDEPENDENT_AMBULATORY_CARE_PROVIDER_SITE_OTHER): Payer: Self-pay | Admitting: Physician Assistant

## 2017-05-05 DIAGNOSIS — S93314D Dislocation of tarsal joint of right foot, subsequent encounter: Secondary | ICD-10-CM | POA: Diagnosis not present
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
## 2017-05-23 ENCOUNTER — Other Ambulatory Visit: Payer: Self-pay | Admitting: Internal Medicine

## 2017-05-23 DIAGNOSIS — E785 Hyperlipidemia, unspecified: Secondary | ICD-10-CM

## 2017-05-23 DIAGNOSIS — E559 Vitamin D deficiency, unspecified: Secondary | ICD-10-CM

## 2017-05-23 DIAGNOSIS — Z1329 Encounter for screening for other suspected endocrine disorder: Secondary | ICD-10-CM

## 2017-05-23 DIAGNOSIS — R7302 Impaired glucose tolerance (oral): Secondary | ICD-10-CM

## 2017-05-23 DIAGNOSIS — Z0000 Encounter for general adult medical examination without abnormal findings: Secondary | ICD-10-CM

## 2017-05-29 DIAGNOSIS — M25461 Effusion, right knee: Secondary | ICD-10-CM | POA: Diagnosis not present

## 2017-05-29 DIAGNOSIS — M170 Bilateral primary osteoarthritis of knee: Secondary | ICD-10-CM | POA: Diagnosis not present

## 2017-05-30 ENCOUNTER — Ambulatory Visit: Payer: Medicare Other | Admitting: Podiatry

## 2017-05-30 ENCOUNTER — Ambulatory Visit: Payer: Self-pay

## 2017-05-30 ENCOUNTER — Ambulatory Visit (INDEPENDENT_AMBULATORY_CARE_PROVIDER_SITE_OTHER): Payer: Self-pay

## 2017-05-30 ENCOUNTER — Encounter: Payer: Self-pay | Admitting: Podiatry

## 2017-05-30 DIAGNOSIS — M7661 Achilles tendinitis, right leg: Secondary | ICD-10-CM

## 2017-05-30 DIAGNOSIS — D169 Benign neoplasm of bone and articular cartilage, unspecified: Secondary | ICD-10-CM | POA: Diagnosis not present

## 2017-05-30 DIAGNOSIS — L720 Epidermal cyst: Secondary | ICD-10-CM | POA: Insufficient documentation

## 2017-05-30 DIAGNOSIS — M25571 Pain in right ankle and joints of right foot: Secondary | ICD-10-CM

## 2017-05-30 DIAGNOSIS — N766 Ulceration of vulva: Secondary | ICD-10-CM | POA: Insufficient documentation

## 2017-05-30 DIAGNOSIS — R300 Dysuria: Secondary | ICD-10-CM | POA: Insufficient documentation

## 2017-05-30 DIAGNOSIS — M779 Enthesopathy, unspecified: Secondary | ICD-10-CM

## 2017-05-30 MED ORDER — TRIAMCINOLONE ACETONIDE 10 MG/ML IJ SUSP
10.0000 mg | Freq: Once | INTRAMUSCULAR | Status: AC
  Administered 2017-05-30: 10 mg

## 2017-06-02 ENCOUNTER — Other Ambulatory Visit: Payer: Medicare Other | Admitting: Internal Medicine

## 2017-06-02 DIAGNOSIS — R7302 Impaired glucose tolerance (oral): Secondary | ICD-10-CM | POA: Diagnosis not present

## 2017-06-02 DIAGNOSIS — E785 Hyperlipidemia, unspecified: Secondary | ICD-10-CM | POA: Diagnosis not present

## 2017-06-02 DIAGNOSIS — Z1329 Encounter for screening for other suspected endocrine disorder: Secondary | ICD-10-CM | POA: Diagnosis not present

## 2017-06-02 DIAGNOSIS — Z0000 Encounter for general adult medical examination without abnormal findings: Secondary | ICD-10-CM | POA: Diagnosis not present

## 2017-06-02 NOTE — Progress Notes (Signed)
Subjective:   Patient ID: Brittany Bender, female   DOB: 67 y.o.   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  For the posterior heel I recommended careful injection I did explain the risk of this procedure and the chances for rupture associated with steroid injection treatment.  Patient wants the procedure I did sterile prep and I injected carefully the lateral side with 3 mg dexamethasone Kenalog 5 mg Xylocaine and advised on reduced activity.  I then educated her on dorsal spurring in the consideration for removal of the spur  X-ray indicates there is dorsal spurring of the midtarsal joint and spurring also posterior heel

## 2017-06-03 ENCOUNTER — Ambulatory Visit (INDEPENDENT_AMBULATORY_CARE_PROVIDER_SITE_OTHER): Payer: Medicare Other | Admitting: Internal Medicine

## 2017-06-03 ENCOUNTER — Encounter: Payer: Self-pay | Admitting: Internal Medicine

## 2017-06-03 VITALS — BP 120/70 | HR 88 | Ht 65.0 in | Wt 249.0 lb

## 2017-06-03 DIAGNOSIS — Z87898 Personal history of other specified conditions: Secondary | ICD-10-CM | POA: Diagnosis not present

## 2017-06-03 DIAGNOSIS — G4733 Obstructive sleep apnea (adult) (pediatric): Secondary | ICD-10-CM | POA: Diagnosis not present

## 2017-06-03 DIAGNOSIS — E559 Vitamin D deficiency, unspecified: Secondary | ICD-10-CM

## 2017-06-03 DIAGNOSIS — R7302 Impaired glucose tolerance (oral): Secondary | ICD-10-CM | POA: Diagnosis not present

## 2017-06-03 DIAGNOSIS — Z853 Personal history of malignant neoplasm of breast: Secondary | ICD-10-CM | POA: Diagnosis not present

## 2017-06-03 DIAGNOSIS — Z0000 Encounter for general adult medical examination without abnormal findings: Secondary | ICD-10-CM

## 2017-06-03 DIAGNOSIS — E785 Hyperlipidemia, unspecified: Secondary | ICD-10-CM | POA: Diagnosis not present

## 2017-06-03 DIAGNOSIS — Z6841 Body Mass Index (BMI) 40.0 and over, adult: Secondary | ICD-10-CM

## 2017-06-03 DIAGNOSIS — Z8709 Personal history of other diseases of the respiratory system: Secondary | ICD-10-CM

## 2017-06-03 LAB — LIPID PANEL
Cholesterol: 245 mg/dL — ABNORMAL HIGH (ref <200)
HDL: 81 mg/dL (ref >50)
LDL Cholesterol (Calc): 146 mg/dL (calc) — ABNORMAL HIGH
Non-HDL Cholesterol (Calc): 164 mg/dL (calc) — ABNORMAL HIGH (ref <130)
Total CHOL/HDL Ratio: 3 (calc) (ref <5.0)
Triglycerides: 76 mg/dL (ref <150)

## 2017-06-03 LAB — CBC WITH DIFFERENTIAL/PLATELET
Basophils Absolute: 30 cells/uL (ref 0–200)
Basophils Relative: 0.8 %
Eosinophils Absolute: 49 cells/uL (ref 15–500)
Eosinophils Relative: 1.3 %
HEMATOCRIT: 36.8 % (ref 35.0–45.0)
Hemoglobin: 12.3 g/dL (ref 11.7–15.5)
LYMPHS ABS: 1695 {cells}/uL (ref 850–3900)
MCH: 30.4 pg (ref 27.0–33.0)
MCHC: 33.4 g/dL (ref 32.0–36.0)
MCV: 90.9 fL (ref 80.0–100.0)
MPV: 10.2 fL (ref 7.5–12.5)
Monocytes Relative: 9.5 %
Neutro Abs: 1664 cells/uL (ref 1500–7800)
Neutrophils Relative %: 43.8 %
Platelets: 255 10*3/uL (ref 140–400)
RBC: 4.05 10*6/uL (ref 3.80–5.10)
RDW: 14.5 % (ref 11.0–15.0)
Total Lymphocyte: 44.6 %
WBC: 3.8 10*3/uL (ref 3.8–10.8)
WBCMIX: 361 {cells}/uL (ref 200–950)

## 2017-06-03 LAB — COMPLETE METABOLIC PANEL WITH GFR
AG Ratio: 1.2 (calc) (ref 1.0–2.5)
ALBUMIN MSPROF: 4.1 g/dL (ref 3.6–5.1)
ALKALINE PHOSPHATASE (APISO): 78 U/L (ref 33–130)
ALT: 9 U/L (ref 6–29)
AST: 16 U/L (ref 10–35)
BUN: 14 mg/dL (ref 7–25)
CALCIUM: 9.1 mg/dL (ref 8.6–10.4)
CO2: 28 mmol/L (ref 20–32)
CREATININE: 0.73 mg/dL (ref 0.50–0.99)
Chloride: 103 mmol/L (ref 98–110)
GFR, EST AFRICAN AMERICAN: 100 mL/min/{1.73_m2} (ref >=60)
GFR, EST NON AFRICAN AMERICAN: 86 mL/min/{1.73_m2} (ref >=60)
GLUCOSE: 96 mg/dL (ref 65–99)
Globulin: 3.4 g/dL (calc) (ref 1.9–3.7)
Potassium: 4.2 mmol/L (ref 3.5–5.3)
Sodium: 138 mmol/L (ref 135–146)
TOTAL PROTEIN: 7.5 g/dL (ref 6.1–8.1)
Total Bilirubin: 0.4 mg/dL (ref 0.2–1.2)

## 2017-06-03 LAB — POCT URINALYSIS DIPSTICK
APPEARANCE: NORMAL
Bilirubin, UA: NEGATIVE
Blood, UA: NEGATIVE
GLUCOSE UA: NEGATIVE
KETONES UA: NEGATIVE
Leukocytes, UA: NEGATIVE
Nitrite, UA: NEGATIVE
ODOR: NORMAL
PH UA: 6 (ref 5.0–8.0)
Protein, UA: NEGATIVE
Spec Grav, UA: 1.015 (ref 1.010–1.025)
UROBILINOGEN UA: 0.2 U/dL

## 2017-06-03 LAB — HEMOGLOBIN A1C
HEMOGLOBIN A1C: 5.8 %{Hb} — AB (ref <5.7)
MEAN PLASMA GLUCOSE: 120 (calc)
eAG (mmol/L): 6.6 (calc)

## 2017-06-03 LAB — TSH: TSH: 2.06 m[IU]/L (ref 0.40–4.50)

## 2017-06-03 MED ORDER — ROSUVASTATIN CALCIUM 5 MG PO TABS: ORAL_TABLET | ORAL | 3 refills | Status: DC
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
## 2017-06-05 ENCOUNTER — Other Ambulatory Visit: Payer: Self-pay | Admitting: Internal Medicine

## 2017-06-05 MED ORDER — METOPROLOL TARTRATE 25 MG PO TABS: 25.0000 mg | ORAL_TABLET | Freq: Two times a day (BID) | ORAL | 3 refills | Status: DC

## 2017-06-05 NOTE — Telephone Encounter (Signed)
ESCRIBED

## 2017-06-05 NOTE — Telephone Encounter (Signed)
Refill x one year °

## 2017-06-05 NOTE — Telephone Encounter (Signed)
Brandon Melnick Self [phone]  [facility]  metoprolol tartrate (LOPRESSOR) 25 MG tablet  Pricella called to say she needs a refill on her metoprolol called in to [facility]

## 2017-06-25 ENCOUNTER — Encounter: Payer: Self-pay | Admitting: Internal Medicine

## 2017-06-25 NOTE — Patient Instructions (Signed)
She will start Crestor 5 mg 3 times a week and follow-up in 6 months.  She will need to watch her diet and lose some weight.  She will try to exercise more now that she is recovered from ankle fracture and hindfoot fracture.

## 2017-07-03 ENCOUNTER — Ambulatory Visit (INDEPENDENT_AMBULATORY_CARE_PROVIDER_SITE_OTHER): Payer: Medicare Other | Admitting: Orthopaedic Surgery

## 2017-07-03 ENCOUNTER — Encounter (INDEPENDENT_AMBULATORY_CARE_PROVIDER_SITE_OTHER): Payer: Self-pay | Admitting: Orthopaedic Surgery

## 2017-07-03 ENCOUNTER — Ambulatory Visit (INDEPENDENT_AMBULATORY_CARE_PROVIDER_SITE_OTHER): Payer: Medicare Other

## 2017-07-03 DIAGNOSIS — S93314D Dislocation of tarsal joint of right foot, subsequent encounter: Secondary | ICD-10-CM
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
## 2017-07-22 ENCOUNTER — Telehealth (INDEPENDENT_AMBULATORY_CARE_PROVIDER_SITE_OTHER): Payer: Self-pay | Admitting: Orthopaedic Surgery

## 2017-07-22 NOTE — Telephone Encounter (Signed)
Paperwork   Pt called would like to check on the status of her impairment rating

## 2017-07-22 NOTE — Telephone Encounter (Signed)
See below

## 2017-07-25 ENCOUNTER — Other Ambulatory Visit (INDEPENDENT_AMBULATORY_CARE_PROVIDER_SITE_OTHER): Payer: Self-pay | Admitting: Orthopaedic Surgery

## 2017-07-25 ENCOUNTER — Encounter (INDEPENDENT_AMBULATORY_CARE_PROVIDER_SITE_OTHER): Payer: Self-pay | Admitting: Orthopaedic Surgery

## 2017-07-25 NOTE — Telephone Encounter (Signed)
I have now done a rating.

## 2017-07-28 NOTE — Telephone Encounter (Signed)
Patient aware note at front desk  

## 2017-08-11 ENCOUNTER — Other Ambulatory Visit: Payer: Self-pay | Admitting: Internal Medicine

## 2017-08-11 DIAGNOSIS — Z1231 Encounter for screening mammogram for malignant neoplasm of breast: Secondary | ICD-10-CM

## 2017-08-19 ENCOUNTER — Telehealth (INDEPENDENT_AMBULATORY_CARE_PROVIDER_SITE_OTHER): Payer: Self-pay | Admitting: Orthopaedic Surgery

## 2017-08-19 NOTE — Telephone Encounter (Signed)
I received a VM from patient requesting copy of records. IC her back and left her msg on vm advising need to complete authorization form

## 2017-08-19 NOTE — Telephone Encounter (Signed)
Patient called back and left msg asking for release form to be faxed to her. I faxed to her at [phone]

## 2017-09-01 DIAGNOSIS — Z01419 Encounter for gynecological examination (general) (routine) without abnormal findings: Secondary | ICD-10-CM | POA: Diagnosis not present

## 2017-09-10 ENCOUNTER — Ambulatory Visit
Admission: RE | Admit: 2017-09-10 | Discharge: 2017-09-10 | Disposition: A | Discharge status: Home / Self Care | Payer: Medicare Other | Source: Ambulatory Visit | Attending: Obstetrics | Admitting: Obstetrics

## 2017-09-10 ENCOUNTER — Ambulatory Visit
Admission: RE | Admit: 2017-09-10 | Discharge: 2017-09-10 | Disposition: A | Discharge status: Home / Self Care | Payer: BC Managed Care – PPO | Source: Ambulatory Visit | Attending: Internal Medicine | Admitting: Internal Medicine

## 2017-09-10 ENCOUNTER — Other Ambulatory Visit: Payer: Self-pay | Admitting: Obstetrics

## 2017-09-10 DIAGNOSIS — N6310 Unspecified lump in the right breast, unspecified quadrant: Secondary | ICD-10-CM

## 2017-09-10 DIAGNOSIS — N63 Unspecified lump in unspecified breast: Secondary | ICD-10-CM

## 2017-09-10 DIAGNOSIS — Z1231 Encounter for screening mammogram for malignant neoplasm of breast: Secondary | ICD-10-CM

## 2017-09-10 DIAGNOSIS — R928 Other abnormal and inconclusive findings on diagnostic imaging of breast: Secondary | ICD-10-CM | POA: Diagnosis not present

## 2017-09-10 DIAGNOSIS — Z853 Personal history of malignant neoplasm of breast: Secondary | ICD-10-CM | POA: Diagnosis not present

## 2017-09-12 ENCOUNTER — Other Ambulatory Visit: Payer: Self-pay | Admitting: Obstetrics

## 2017-09-12 ENCOUNTER — Ambulatory Visit
Admission: RE | Admit: 2017-09-12 | Discharge: 2017-09-12 | Disposition: A | Discharge status: Home / Self Care | Payer: Medicare Other | Source: Ambulatory Visit | Attending: Obstetrics | Admitting: Obstetrics

## 2017-09-12 DIAGNOSIS — N6310 Unspecified lump in the right breast, unspecified quadrant: Secondary | ICD-10-CM

## 2017-09-12 DIAGNOSIS — N6311 Unspecified lump in the right breast, upper outer quadrant: Secondary | ICD-10-CM | POA: Diagnosis not present

## 2017-09-12 DIAGNOSIS — N641 Fat necrosis of breast: Secondary | ICD-10-CM | POA: Diagnosis not present

## 2017-11-27 ENCOUNTER — Ambulatory Visit (INDEPENDENT_AMBULATORY_CARE_PROVIDER_SITE_OTHER): Payer: Medicare Other | Admitting: Internal Medicine

## 2017-11-27 DIAGNOSIS — Z23 Encounter for immunization: Secondary | ICD-10-CM | POA: Diagnosis not present

## 2017-11-27 NOTE — Patient Instructions (Signed)
Patient received a flu vaccine IM L deltoid, AV, CMA  

## 2017-12-02 ENCOUNTER — Other Ambulatory Visit: Payer: Self-pay | Admitting: Internal Medicine

## 2017-12-02 DIAGNOSIS — E559 Vitamin D deficiency, unspecified: Secondary | ICD-10-CM

## 2017-12-02 DIAGNOSIS — Z79899 Other long term (current) drug therapy: Secondary | ICD-10-CM

## 2017-12-02 DIAGNOSIS — E785 Hyperlipidemia, unspecified: Secondary | ICD-10-CM

## 2017-12-02 DIAGNOSIS — Z5181 Encounter for therapeutic drug level monitoring: Secondary | ICD-10-CM

## 2017-12-02 DIAGNOSIS — R7302 Impaired glucose tolerance (oral): Secondary | ICD-10-CM

## 2017-12-04 ENCOUNTER — Other Ambulatory Visit: Payer: Medicare Other | Admitting: Internal Medicine

## 2017-12-04 DIAGNOSIS — Z79899 Other long term (current) drug therapy: Secondary | ICD-10-CM

## 2017-12-04 DIAGNOSIS — E559 Vitamin D deficiency, unspecified: Secondary | ICD-10-CM | POA: Diagnosis not present

## 2017-12-04 DIAGNOSIS — E785 Hyperlipidemia, unspecified: Secondary | ICD-10-CM | POA: Diagnosis not present

## 2017-12-04 DIAGNOSIS — Z5181 Encounter for therapeutic drug level monitoring: Secondary | ICD-10-CM | POA: Diagnosis not present

## 2017-12-04 DIAGNOSIS — R7302 Impaired glucose tolerance (oral): Secondary | ICD-10-CM

## 2017-12-05 ENCOUNTER — Ambulatory Visit: Payer: Medicare Other | Admitting: Internal Medicine

## 2017-12-05 LAB — HEMOGLOBIN A1C
Hgb A1c MFr Bld: 5.7 % of total Hgb — ABNORMAL HIGH (ref <5.7)
MEAN PLASMA GLUCOSE: 117 (calc)
eAG (mmol/L): 6.5 (calc)

## 2017-12-05 LAB — HEPATIC FUNCTION PANEL
AG RATIO: 1.2 (calc) (ref 1.0–2.5)
ALKALINE PHOSPHATASE (APISO): 71 U/L (ref 33–130)
ALT: 10 U/L (ref 6–29)
AST: 16 U/L (ref 10–35)
Albumin: 4.2 g/dL (ref 3.6–5.1)
BILIRUBIN TOTAL: 0.4 mg/dL (ref 0.2–1.2)
Bilirubin, Direct: 0.1 mg/dL (ref 0.0–0.2)
Globulin: 3.5 g/dL (calc) (ref 1.9–3.7)
Indirect Bilirubin: 0.3 mg/dL (calc) (ref 0.2–1.2)
Total Protein: 7.7 g/dL (ref 6.1–8.1)

## 2017-12-05 LAB — LIPID PANEL
Cholesterol: 199 mg/dL (ref <200)
HDL: 73 mg/dL (ref >50)
LDL Cholesterol (Calc): 111 mg/dL (calc) — ABNORMAL HIGH
NON-HDL CHOLESTEROL (CALC): 126 mg/dL (ref <130)
Total CHOL/HDL Ratio: 2.7 (calc) (ref <5.0)
Triglycerides: 68 mg/dL (ref <150)

## 2017-12-05 LAB — MICROALBUMIN / CREATININE URINE RATIO
Creatinine, Urine: 122 mg/dL (ref 20–275)
MICROALB UR: 0.7 mg/dL
MICROALB/CREAT RATIO: 6 ug/mg{creat} (ref <30)

## 2017-12-05 LAB — VITAMIN D 25 HYDROXY (VIT D DEFICIENCY, FRACTURES): Vit D, 25-Hydroxy: 25 ng/mL — ABNORMAL LOW (ref 30–100)

## 2017-12-08 ENCOUNTER — Encounter: Payer: Self-pay | Admitting: Internal Medicine

## 2017-12-08 ENCOUNTER — Ambulatory Visit (INDEPENDENT_AMBULATORY_CARE_PROVIDER_SITE_OTHER): Payer: Medicare Other | Admitting: Internal Medicine

## 2017-12-08 VITALS — BP 120/80 | HR 78 | Temp 98.3°F | Ht 65.0 in | Wt 250.0 lb

## 2017-12-08 DIAGNOSIS — Z23 Encounter for immunization: Secondary | ICD-10-CM

## 2017-12-08 DIAGNOSIS — Z6841 Body Mass Index (BMI) 40.0 and over, adult: Secondary | ICD-10-CM | POA: Diagnosis not present

## 2017-12-08 DIAGNOSIS — E559 Vitamin D deficiency, unspecified: Secondary | ICD-10-CM

## 2017-12-08 DIAGNOSIS — Z853 Personal history of malignant neoplasm of breast: Secondary | ICD-10-CM | POA: Diagnosis not present

## 2017-12-08 DIAGNOSIS — Z8709 Personal history of other diseases of the respiratory system: Secondary | ICD-10-CM

## 2017-12-08 DIAGNOSIS — Z87898 Personal history of other specified conditions: Secondary | ICD-10-CM | POA: Diagnosis not present

## 2017-12-08 DIAGNOSIS — E7800 Pure hypercholesterolemia, unspecified: Secondary | ICD-10-CM

## 2017-12-08 DIAGNOSIS — G4733 Obstructive sleep apnea (adult) (pediatric): Secondary | ICD-10-CM | POA: Diagnosis not present

## 2017-12-08 DIAGNOSIS — I5189 Other ill-defined heart diseases: Secondary | ICD-10-CM | POA: Diagnosis not present

## 2017-12-08 DIAGNOSIS — R7302 Impaired glucose tolerance (oral): Secondary | ICD-10-CM | POA: Diagnosis not present

## 2017-12-08 NOTE — Progress Notes (Addendum)
   Subjective:    Patient ID: Brittany Bender, female    DOB: October 18, 1950, 67 y.o.   MRN: [id number]  HPI 67 year old Female in today for six-month follow-up of multiple medical issues including hypertension, asthma and allergic rhinitis, remote history of breast cancer in 2000, history of tachycardia treated by cardiologist, hyperlipidemia, impaired glucose tolerance, history of sleep apnea.  In May she was started on Crestor for hyperlipidemia.  She is enjoying retirement.  Does help take care of significant other's mother in the [place] area.  Does not travel up there as much as she did previously.  Taking some time for herself.  Labs drawn prior to visit showed vitamin D level of 25.  Recommend 4000 units vitamin D3 daily as she says she is taking 2000 units daily nail.  Hemoglobin A1c stable at 5.7% in 6 months ago was 5.8% lipid panel normal with the exception of LDL of 111 and 6 months ago was 146.  She is now on low-dose Crestor 3 times a week.  Remote history of breast cancer and had right breast biopsy in August that was normal.    Review of Systems no new complaints.  Received flu vaccine in late October and will get Prevnar 13 today.     Objective:   Physical Exam Neck is supple without JVD thyromegaly or carotid bruits.  No thyromegaly.  Chest clear to auscultation.  Cardiac exam regular rate and rhythm normal S1 and S2.  His blood pressure is normal at 120/80.  Pulse is 78.  BMI is 41.60.  Extremities without pitting edema.       Assessment & Plan:  Morbid obesity-try to work on diet exercise and weight loss  Impaired glucose tolerance-stable  Health maintenance-Prevnar 13 given today and had flu vaccine in late October  History of breast cancer and recent right breast biopsy was negative  Pure hypercholesterolemia treated with low-dose statin  History of asthma  History of tachycardia seen by cardiologist and pulse is normal  History of allergic  rhinitis  Diastolic dysfunction  Vitamin D deficiency-take 4000 units vitamin D3 daily to bring level up to normal and she is taking 2000 units daily now.  Plan: Continue current medications and return for physical exam in 6 months.

## 2017-12-27 NOTE — Patient Instructions (Signed)
It was a pleasure to see you today.  Prevnar 13 given.  Continue same medications.  Try to work on diet exercise and weight loss.  Return in 6 months.

## 2017-12-29 ENCOUNTER — Other Ambulatory Visit (INDEPENDENT_AMBULATORY_CARE_PROVIDER_SITE_OTHER): Payer: Self-pay

## 2017-12-29 MED ORDER — IBUPROFEN 800 MG PO TABS: 800.0000 mg | ORAL_TABLET | Freq: Three times a day (TID) | ORAL | 1 refills | Status: DC | PRN

## 2018-04-04 ENCOUNTER — Other Ambulatory Visit: Payer: Self-pay | Admitting: Internal Medicine

## 2018-06-09 ENCOUNTER — Other Ambulatory Visit: Payer: Self-pay

## 2018-06-09 ENCOUNTER — Other Ambulatory Visit: Payer: Medicare Other | Admitting: Internal Medicine

## 2018-06-09 DIAGNOSIS — Z853 Personal history of malignant neoplasm of breast: Secondary | ICD-10-CM

## 2018-06-09 DIAGNOSIS — I5189 Other ill-defined heart diseases: Secondary | ICD-10-CM

## 2018-06-09 DIAGNOSIS — G4733 Obstructive sleep apnea (adult) (pediatric): Secondary | ICD-10-CM

## 2018-06-09 DIAGNOSIS — E559 Vitamin D deficiency, unspecified: Secondary | ICD-10-CM

## 2018-06-09 DIAGNOSIS — Z0000 Encounter for general adult medical examination without abnormal findings: Secondary | ICD-10-CM

## 2018-06-09 DIAGNOSIS — R7302 Impaired glucose tolerance (oral): Secondary | ICD-10-CM

## 2018-06-09 DIAGNOSIS — G4730 Sleep apnea, unspecified: Secondary | ICD-10-CM

## 2018-06-09 DIAGNOSIS — E7800 Pure hypercholesterolemia, unspecified: Secondary | ICD-10-CM

## 2018-06-10 LAB — COMPLETE METABOLIC PANEL WITH GFR
AG Ratio: 1.1 (calc) (ref 1.0–2.5)
ALT: 6 U/L (ref 6–29)
AST: 13 U/L (ref 10–35)
Albumin: 3.9 g/dL (ref 3.6–5.1)
Alkaline phosphatase (APISO): 69 U/L (ref 37–153)
BUN: 9 mg/dL (ref 7–25)
CO2: 24 mmol/L (ref 20–32)
Calcium: 9.1 mg/dL (ref 8.6–10.4)
Chloride: 108 mmol/L (ref 98–110)
Creat: 0.72 mg/dL (ref 0.50–0.99)
GFR, Est African American: 101 mL/min/{1.73_m2} (ref >=60)
GFR, Est Non African American: 87 mL/min/{1.73_m2} (ref >=60)
Globulin: 3.4 g/dL (calc) (ref 1.9–3.7)
Glucose, Bld: 98 mg/dL (ref 65–99)
Potassium: 3.8 mmol/L (ref 3.5–5.3)
Sodium: 141 mmol/L (ref 135–146)
Total Bilirubin: 0.3 mg/dL (ref 0.2–1.2)
Total Protein: 7.3 g/dL (ref 6.1–8.1)

## 2018-06-10 LAB — CBC WITH DIFFERENTIAL/PLATELET
Absolute Monocytes: 246 cells/uL (ref 200–950)
Basophils Absolute: 19 cells/uL (ref 0–200)
Basophils Relative: 0.6 %
Eosinophils Absolute: 99 cells/uL (ref 15–500)
Eosinophils Relative: 3.1 %
HCT: 35.1 % (ref 35.0–45.0)
Hemoglobin: 11.6 g/dL — ABNORMAL LOW (ref 11.7–15.5)
Lymphs Abs: 1418 cells/uL (ref 850–3900)
MCH: 30.1 pg (ref 27.0–33.0)
MCHC: 33 g/dL (ref 32.0–36.0)
MCV: 91.2 fL (ref 80.0–100.0)
MPV: 10.2 fL (ref 7.5–12.5)
Monocytes Relative: 7.7 %
Neutro Abs: 1418 cells/uL — ABNORMAL LOW (ref 1500–7800)
Neutrophils Relative %: 44.3 %
Platelets: 244 10*3/uL (ref 140–400)
RBC: 3.85 10*6/uL (ref 3.80–5.10)
RDW: 14.5 % (ref 11.0–15.0)
Total Lymphocyte: 44.3 %
WBC: 3.2 10*3/uL — ABNORMAL LOW (ref 3.8–10.8)

## 2018-06-10 LAB — LIPID PANEL
Cholesterol: 204 mg/dL — ABNORMAL HIGH (ref <200)
HDL: 70 mg/dL (ref >=50)
LDL Cholesterol (Calc): 116 mg/dL (calc) — ABNORMAL HIGH
Non-HDL Cholesterol (Calc): 134 mg/dL (calc) — ABNORMAL HIGH (ref <130)
Total CHOL/HDL Ratio: 2.9 (calc) (ref <5.0)
Triglycerides: 81 mg/dL (ref <150)

## 2018-06-10 LAB — HEMOGLOBIN A1C
Hgb A1c MFr Bld: 5.7 % of total Hgb — ABNORMAL HIGH (ref <5.7)
Mean Plasma Glucose: 117 (calc)
eAG (mmol/L): 6.5 (calc)

## 2018-06-10 LAB — MICROALBUMIN / CREATININE URINE RATIO
Creatinine, Urine: 116 mg/dL (ref 20–275)
Microalb Creat Ratio: 4 mcg/mg creat (ref <30)
Microalb, Ur: 0.5 mg/dL

## 2018-06-10 LAB — VITAMIN D 25 HYDROXY (VIT D DEFICIENCY, FRACTURES): Vit D, 25-Hydroxy: 27 ng/mL — ABNORMAL LOW (ref 30–100)

## 2018-06-10 LAB — TSH: TSH: 2.04 mIU/L (ref 0.40–4.50)

## 2018-06-12 ENCOUNTER — Other Ambulatory Visit: Payer: Self-pay

## 2018-06-12 ENCOUNTER — Ambulatory Visit (INDEPENDENT_AMBULATORY_CARE_PROVIDER_SITE_OTHER): Payer: Medicare Other | Admitting: Internal Medicine

## 2018-06-12 ENCOUNTER — Encounter: Payer: Self-pay | Admitting: Internal Medicine

## 2018-06-12 VITALS — BP 122/88 | HR 82 | Temp 98.3°F | Wt 241.0 lb

## 2018-06-12 DIAGNOSIS — Z87898 Personal history of other specified conditions: Secondary | ICD-10-CM | POA: Diagnosis not present

## 2018-06-12 DIAGNOSIS — E7800 Pure hypercholesterolemia, unspecified: Secondary | ICD-10-CM

## 2018-06-12 DIAGNOSIS — G4733 Obstructive sleep apnea (adult) (pediatric): Secondary | ICD-10-CM | POA: Diagnosis not present

## 2018-06-12 DIAGNOSIS — Z6841 Body Mass Index (BMI) 40.0 and over, adult: Secondary | ICD-10-CM

## 2018-06-12 DIAGNOSIS — Z8709 Personal history of other diseases of the respiratory system: Secondary | ICD-10-CM

## 2018-06-12 DIAGNOSIS — R7302 Impaired glucose tolerance (oral): Secondary | ICD-10-CM

## 2018-06-12 DIAGNOSIS — I5189 Other ill-defined heart diseases: Secondary | ICD-10-CM | POA: Diagnosis not present

## 2018-06-12 DIAGNOSIS — Z0000 Encounter for general adult medical examination without abnormal findings: Secondary | ICD-10-CM

## 2018-06-12 DIAGNOSIS — Z8659 Personal history of other mental and behavioral disorders: Secondary | ICD-10-CM

## 2018-06-12 DIAGNOSIS — Z853 Personal history of malignant neoplasm of breast: Secondary | ICD-10-CM

## 2018-06-12 MED ORDER — MONTELUKAST SODIUM 10 MG PO TABS: 10.0000 mg | ORAL_TABLET | ORAL | 1 refills | Status: DC | PRN

## 2018-06-12 MED ORDER — METOPROLOL TARTRATE 25 MG PO TABS: 25.0000 mg | ORAL_TABLET | Freq: Two times a day (BID) | ORAL | 1 refills | Status: DC

## 2018-06-12 MED ORDER — LINACLOTIDE 290 MCG PO CAPS: 290.0000 ug | ORAL_CAPSULE | Freq: Every day | ORAL | 0 refills | Status: DC

## 2018-06-12 MED ORDER — LEVOCETIRIZINE DIHYDROCHLORIDE 5 MG PO TABS: 5.0000 mg | ORAL_TABLET | Freq: Every day | ORAL | 1 refills | Status: DC | PRN

## 2018-06-18 ENCOUNTER — Other Ambulatory Visit: Payer: Self-pay | Admitting: Internal Medicine

## 2018-06-28 MED ORDER — ALPRAZOLAM 0.5 MG PO TABS: 0.5000 mg | ORAL_TABLET | Freq: Every evening | ORAL | 1 refills | Status: DC | PRN
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
## 2018-06-28 NOTE — Patient Instructions (Addendum)
Xanax refilled for anxiety.  Consider Dr. Migdalia Dk clinic for weight loss.  Continue current medications.  Have annual flu vaccine and annual mammogram.  Will need pneumococcal 23 Fall of 2020.  It was a pleasure to see you today.

## 2018-08-25 ENCOUNTER — Other Ambulatory Visit: Payer: Self-pay | Admitting: Obstetrics

## 2018-08-25 DIAGNOSIS — Z1231 Encounter for screening mammogram for malignant neoplasm of breast: Secondary | ICD-10-CM

## 2018-09-23 ENCOUNTER — Ambulatory Visit (INDEPENDENT_AMBULATORY_CARE_PROVIDER_SITE_OTHER): Payer: Medicare Other | Admitting: Internal Medicine

## 2018-09-23 ENCOUNTER — Other Ambulatory Visit: Payer: Self-pay

## 2018-09-23 ENCOUNTER — Encounter: Payer: Self-pay | Admitting: Internal Medicine

## 2018-09-23 VITALS — BP 120/80 | HR 84 | Temp 98.3°F | Ht 65.0 in | Wt 241.0 lb

## 2018-09-23 DIAGNOSIS — Z23 Encounter for immunization: Secondary | ICD-10-CM | POA: Diagnosis not present

## 2018-09-23 NOTE — Progress Notes (Signed)
Flu vaccine given by CMA 

## 2018-09-23 NOTE — Patient Instructions (Signed)
Flu vaccine given.

## 2018-10-12 ENCOUNTER — Ambulatory Visit: Payer: BC Managed Care – PPO

## 2018-10-13 ENCOUNTER — Other Ambulatory Visit: Payer: Self-pay

## 2018-10-13 ENCOUNTER — Ambulatory Visit
Admission: RE | Admit: 2018-10-13 | Discharge: 2018-10-13 | Disposition: A | Discharge status: Home / Self Care | Payer: Medicare Other | Source: Ambulatory Visit | Attending: Obstetrics | Admitting: Obstetrics

## 2018-10-13 DIAGNOSIS — Z1231 Encounter for screening mammogram for malignant neoplasm of breast: Secondary | ICD-10-CM

## 2018-12-06 ENCOUNTER — Other Ambulatory Visit: Payer: Self-pay | Admitting: Internal Medicine

## 2018-12-27 ENCOUNTER — Other Ambulatory Visit: Payer: Self-pay | Admitting: Internal Medicine

## 2019-02-28 ENCOUNTER — Ambulatory Visit: Payer: Medicare PPO | Attending: Internal Medicine

## 2019-02-28 ENCOUNTER — Ambulatory Visit: Payer: Medicare Other

## 2019-02-28 DIAGNOSIS — Z23 Encounter for immunization: Secondary | ICD-10-CM

## 2019-02-28 NOTE — Progress Notes (Signed)
   Covid-19 Vaccination Clinic  Name:  Brittany Bender    MRN: [id number] DOB: 08-13-1950  02/28/2019  Brittany Bender was observed post Covid-19 immunization for 15 minutes without incidence. She was provided with Vaccine Information Sheet and instruction to access the V-Safe system.   Brittany Bender was instructed to call 911 with any severe reactions post vaccine: [name] Difficulty breathing  . Swelling of your face and throat  . A fast heartbeat  . A bad rash all over your body  . Dizziness and weakness    Immunizations Administered    Name Date Dose VIS Date Route   Pfizer COVID-19 Vaccine 02/28/2019  2:03 PM 0.3 mL 01/08/2019 Intramuscular   Manufacturer: [place]   Lot: [id number]   [place]: [id number]

## 2019-03-07 ENCOUNTER — Ambulatory Visit: Payer: Medicare PPO

## 2019-03-11 ENCOUNTER — Ambulatory Visit: Payer: Medicare Other

## 2019-03-19 ENCOUNTER — Ambulatory Visit: Payer: Medicare PPO

## 2019-03-21 ENCOUNTER — Ambulatory Visit: Payer: Medicare PPO | Attending: Internal Medicine

## 2019-03-21 ENCOUNTER — Ambulatory Visit: Payer: Medicare PPO

## 2019-03-21 DIAGNOSIS — Z23 Encounter for immunization: Secondary | ICD-10-CM | POA: Insufficient documentation

## 2019-03-21 NOTE — Progress Notes (Signed)
   Covid-19 Vaccination Clinic  Name:  SUZZETTE MCQUIRE    MRN: [id number] DOB: October 26, 1950  03/21/2019  Ms. Makar was observed post Covid-19 immunization for 15 minutes without incidence. She was provided with Vaccine Information Sheet and instruction to access the V-Safe system.   Ms. Depp was instructed to call 911 with any severe reactions post vaccine: [name] Difficulty breathing  . Swelling of your face and throat  . A fast heartbeat  . A bad rash all over your body  . Dizziness and weakness    Immunizations Administered    Name Date Dose VIS Date Route   Pfizer COVID-19 Vaccine 03/21/2019 10:33 AM 0.3 mL 01/08/2019 Intramuscular   Manufacturer: [place]   Lot: [id number]   [place]: [id number]

## 2019-04-08 ENCOUNTER — Other Ambulatory Visit: Payer: Self-pay | Admitting: Internal Medicine

## 2019-04-27 ENCOUNTER — Telehealth: Payer: Self-pay | Admitting: Internal Medicine

## 2019-04-27 NOTE — Telephone Encounter (Signed)
LVM to Call office need to schedule CPE (Medicare) with Labs prior due 06/13/2019 per [place]

## 2019-04-27 NOTE — Telephone Encounter (Signed)
Scheduled

## 2019-05-24 ENCOUNTER — Other Ambulatory Visit: Payer: Self-pay | Admitting: Internal Medicine

## 2019-05-28 ENCOUNTER — Other Ambulatory Visit: Payer: Self-pay | Admitting: Obstetrics

## 2019-05-28 DIAGNOSIS — Z1231 Encounter for screening mammogram for malignant neoplasm of breast: Secondary | ICD-10-CM

## 2019-06-10 ENCOUNTER — Other Ambulatory Visit: Payer: Self-pay

## 2019-06-10 ENCOUNTER — Other Ambulatory Visit: Payer: Medicare PPO | Admitting: Internal Medicine

## 2019-06-10 DIAGNOSIS — Z0000 Encounter for general adult medical examination without abnormal findings: Secondary | ICD-10-CM

## 2019-06-10 DIAGNOSIS — E559 Vitamin D deficiency, unspecified: Secondary | ICD-10-CM

## 2019-06-10 DIAGNOSIS — G4733 Obstructive sleep apnea (adult) (pediatric): Secondary | ICD-10-CM

## 2019-06-10 DIAGNOSIS — Z8709 Personal history of other diseases of the respiratory system: Secondary | ICD-10-CM

## 2019-06-10 DIAGNOSIS — R7302 Impaired glucose tolerance (oral): Secondary | ICD-10-CM

## 2019-06-10 DIAGNOSIS — Z853 Personal history of malignant neoplasm of breast: Secondary | ICD-10-CM | POA: Diagnosis not present

## 2019-06-10 DIAGNOSIS — E7800 Pure hypercholesterolemia, unspecified: Secondary | ICD-10-CM

## 2019-06-10 DIAGNOSIS — I5189 Other ill-defined heart diseases: Secondary | ICD-10-CM

## 2019-06-11 LAB — HEMOGLOBIN A1C
Hgb A1c MFr Bld: 5.5 % of total Hgb (ref <5.7)
Mean Plasma Glucose: 111 (calc)
eAG (mmol/L): 6.2 (calc)

## 2019-06-11 LAB — COMPLETE METABOLIC PANEL WITH GFR
AG Ratio: 1.2 (calc) (ref 1.0–2.5)
ALT: 6 U/L (ref 6–29)
AST: 15 U/L (ref 10–35)
Albumin: 4 g/dL (ref 3.6–5.1)
Alkaline phosphatase (APISO): 68 U/L (ref 37–153)
BUN: 12 mg/dL (ref 7–25)
CO2: 27 mmol/L (ref 20–32)
Calcium: 8.9 mg/dL (ref 8.6–10.4)
Chloride: 105 mmol/L (ref 98–110)
Creat: 0.76 mg/dL (ref 0.50–0.99)
GFR, Est African American: 94 mL/min/{1.73_m2} (ref >=60)
GFR, Est Non African American: 81 mL/min/{1.73_m2} (ref >=60)
Globulin: 3.4 g/dL (calc) (ref 1.9–3.7)
Glucose, Bld: 91 mg/dL (ref 65–99)
Potassium: 3.7 mmol/L (ref 3.5–5.3)
Sodium: 138 mmol/L (ref 135–146)
Total Bilirubin: 0.5 mg/dL (ref 0.2–1.2)
Total Protein: 7.4 g/dL (ref 6.1–8.1)

## 2019-06-11 LAB — CBC WITH DIFFERENTIAL/PLATELET
Absolute Monocytes: 339 cells/uL (ref 200–950)
Basophils Absolute: 29 cells/uL (ref 0–200)
Basophils Relative: 1 %
Eosinophils Absolute: 61 cells/uL (ref 15–500)
Eosinophils Relative: 2.1 %
HCT: 35 % (ref 35.0–45.0)
Hemoglobin: 11.6 g/dL — ABNORMAL LOW (ref 11.7–15.5)
Lymphs Abs: 1264 cells/uL (ref 850–3900)
MCH: 30.4 pg (ref 27.0–33.0)
MCHC: 33.1 g/dL (ref 32.0–36.0)
MCV: 91.6 fL (ref 80.0–100.0)
MPV: 10.4 fL (ref 7.5–12.5)
Monocytes Relative: 11.7 %
Neutro Abs: 1206 cells/uL — ABNORMAL LOW (ref 1500–7800)
Neutrophils Relative %: 41.6 %
Platelets: 209 10*3/uL (ref 140–400)
RBC: 3.82 10*6/uL (ref 3.80–5.10)
RDW: 14.3 % (ref 11.0–15.0)
Total Lymphocyte: 43.6 %
WBC: 2.9 10*3/uL — ABNORMAL LOW (ref 3.8–10.8)

## 2019-06-11 LAB — TSH: TSH: 2.06 mIU/L (ref 0.40–4.50)

## 2019-06-11 LAB — LIPID PANEL
Cholesterol: 168 mg/dL (ref <200)
HDL: 78 mg/dL (ref >=50)
LDL Cholesterol (Calc): 76 mg/dL (calc)
Non-HDL Cholesterol (Calc): 90 mg/dL (calc) (ref <130)
Total CHOL/HDL Ratio: 2.2 (calc) (ref <5.0)
Triglycerides: 59 mg/dL (ref <150)

## 2019-06-11 LAB — VITAMIN D 25 HYDROXY (VIT D DEFICIENCY, FRACTURES): Vit D, 25-Hydroxy: 20 ng/mL — ABNORMAL LOW (ref 30–100)

## 2019-06-14 ENCOUNTER — Encounter: Payer: Self-pay | Admitting: Internal Medicine

## 2019-06-14 ENCOUNTER — Ambulatory Visit (INDEPENDENT_AMBULATORY_CARE_PROVIDER_SITE_OTHER): Payer: Medicare PPO | Admitting: Internal Medicine

## 2019-06-14 ENCOUNTER — Other Ambulatory Visit: Payer: Self-pay

## 2019-06-14 VITALS — BP 130/80 | Temp 97.8°F | Ht 64.25 in | Wt 239.0 lb

## 2019-06-14 DIAGNOSIS — Z0000 Encounter for general adult medical examination without abnormal findings: Secondary | ICD-10-CM | POA: Diagnosis not present

## 2019-06-14 DIAGNOSIS — J4520 Mild intermittent asthma, uncomplicated: Secondary | ICD-10-CM | POA: Diagnosis not present

## 2019-06-14 DIAGNOSIS — I5189 Other ill-defined heart diseases: Secondary | ICD-10-CM | POA: Diagnosis not present

## 2019-06-14 DIAGNOSIS — Z8659 Personal history of other mental and behavioral disorders: Secondary | ICD-10-CM | POA: Diagnosis not present

## 2019-06-14 DIAGNOSIS — R7302 Impaired glucose tolerance (oral): Secondary | ICD-10-CM | POA: Diagnosis not present

## 2019-06-14 DIAGNOSIS — Z8679 Personal history of other diseases of the circulatory system: Secondary | ICD-10-CM

## 2019-06-14 DIAGNOSIS — Z853 Personal history of malignant neoplasm of breast: Secondary | ICD-10-CM | POA: Diagnosis not present

## 2019-06-14 DIAGNOSIS — E7800 Pure hypercholesterolemia, unspecified: Secondary | ICD-10-CM | POA: Diagnosis not present

## 2019-06-14 DIAGNOSIS — G4733 Obstructive sleep apnea (adult) (pediatric): Secondary | ICD-10-CM | POA: Diagnosis not present

## 2019-06-14 DIAGNOSIS — E559 Vitamin D deficiency, unspecified: Secondary | ICD-10-CM

## 2019-06-14 DIAGNOSIS — Z6841 Body Mass Index (BMI) 40.0 and over, adult: Secondary | ICD-10-CM

## 2019-06-14 DIAGNOSIS — J302 Other seasonal allergic rhinitis: Secondary | ICD-10-CM

## 2019-06-14 DIAGNOSIS — K5909 Other constipation: Secondary | ICD-10-CM

## 2019-06-14 LAB — POCT URINALYSIS DIPSTICK
Appearance: NEGATIVE
Bilirubin, UA: NEGATIVE
Blood, UA: NEGATIVE
Glucose, UA: NEGATIVE
Ketones, UA: NEGATIVE
Leukocytes, UA: NEGATIVE
Nitrite, UA: NEGATIVE
Odor: NEGATIVE
Protein, UA: NEGATIVE
Spec Grav, UA: 1.01 (ref 1.010–1.025)
Urobilinogen, UA: 0.2 E.U./dL
pH, UA: 6.5 (ref 5.0–8.0)

## 2019-06-14 MED ORDER — ALPRAZOLAM 0.5 MG PO TABS: 0.5000 mg | ORAL_TABLET | Freq: Every evening | ORAL | 1 refills | Status: DC | PRN

## 2019-06-14 MED ORDER — ERGOCALCIFEROL 1.25 MG (50000 UT) PO CAPS: 50000.0000 [IU] | ORAL_CAPSULE | ORAL | 3 refills | Status: DC

## 2019-06-14 MED ORDER — CELECOXIB 200 MG PO CAPS
200.0000 mg | ORAL_CAPSULE | Freq: Every day | ORAL | 2 refills | Status: DC
Start: 2019-06-14 — End: 2020-08-13

## 2019-06-28 ENCOUNTER — Encounter: Payer: Self-pay | Admitting: Internal Medicine
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
## 2019-06-28 NOTE — Patient Instructions (Addendum)
It was a pleasure to see you today.  Continue current medications.  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  Watch diet and try to exercise.  No change in chronic medications.

## 2019-08-24 DIAGNOSIS — H40013 Open angle with borderline findings, low risk, bilateral: Secondary | ICD-10-CM | POA: Diagnosis not present

## 2019-09-06 DIAGNOSIS — Z853 Personal history of malignant neoplasm of breast: Secondary | ICD-10-CM | POA: Diagnosis not present

## 2019-09-06 DIAGNOSIS — Z01419 Encounter for gynecological examination (general) (routine) without abnormal findings: Secondary | ICD-10-CM | POA: Diagnosis not present

## 2019-09-06 DIAGNOSIS — Z6841 Body Mass Index (BMI) 40.0 and over, adult: Secondary | ICD-10-CM | POA: Diagnosis not present

## 2019-09-07 ENCOUNTER — Other Ambulatory Visit: Payer: Self-pay

## 2019-09-07 ENCOUNTER — Other Ambulatory Visit: Payer: Medicare PPO | Admitting: Internal Medicine

## 2019-09-07 DIAGNOSIS — D72819 Decreased white blood cell count, unspecified: Secondary | ICD-10-CM | POA: Diagnosis not present

## 2019-09-08 LAB — CBC WITH DIFFERENTIAL/PLATELET
Absolute Monocytes: 298 cells/uL (ref 200–950)
Basophils Absolute: 19 cells/uL (ref 0–200)
Basophils Relative: 0.6 %
Eosinophils Absolute: 99 cells/uL (ref 15–500)
Eosinophils Relative: 3.1 %
HCT: 35.5 % (ref 35.0–45.0)
Hemoglobin: 11.6 g/dL — ABNORMAL LOW (ref 11.7–15.5)
Lymphs Abs: 1482 cells/uL (ref 850–3900)
MCH: 30.3 pg (ref 27.0–33.0)
MCHC: 32.7 g/dL (ref 32.0–36.0)
MCV: 92.7 fL (ref 80.0–100.0)
MPV: 10.4 fL (ref 7.5–12.5)
Monocytes Relative: 9.3 %
Neutro Abs: 1302 cells/uL — ABNORMAL LOW (ref 1500–7800)
Neutrophils Relative %: 40.7 %
Platelets: 221 10*3/uL (ref 140–400)
RBC: 3.83 10*6/uL (ref 3.80–5.10)
RDW: 14.4 % (ref 11.0–15.0)
Total Lymphocyte: 46.3 %
WBC: 3.2 10*3/uL — ABNORMAL LOW (ref 3.8–10.8)

## 2019-09-08 LAB — PATHOLOGIST SMEAR REVIEW

## 2019-09-09 ENCOUNTER — Encounter: Payer: Self-pay | Admitting: Internal Medicine

## 2019-09-09 ENCOUNTER — Other Ambulatory Visit: Payer: Medicare PPO | Admitting: Internal Medicine

## 2019-09-09 ENCOUNTER — Telehealth: Payer: Self-pay | Admitting: Internal Medicine

## 2019-09-09 NOTE — Telephone Encounter (Signed)
Phone call to patient today regarding repeat CBC which we have followed due to mild anemia and low WBC count. We also ordered path review of smear through [facility] which reveals absolute neutropenia. Platelets are unremarkable.   Normochromic normocytic anemia with unremarkable RBC. Pathologist favors reactive process.  Patient is asymptomatic. However discussed with her having Hematologist see her regarding these findings and she agrees.  Will make referral.

## 2019-09-13 ENCOUNTER — Telehealth: Payer: Self-pay | Admitting: Physician Assistant

## 2019-09-13 NOTE — Telephone Encounter (Signed)
Received a new hem referral from Dr. Renold Genta for anemia and neutropenia. Brittany Bender has been cld and scheduled to see Cassie on 8/24 at 10am w/labs at 930am. Pt aware to arrive 15 minutes early.

## 2019-09-14 DIAGNOSIS — M545 Low back pain: Secondary | ICD-10-CM | POA: Diagnosis not present

## 2019-09-20 DIAGNOSIS — M5416 Radiculopathy, lumbar region: Secondary | ICD-10-CM | POA: Diagnosis not present

## 2019-09-20 NOTE — Progress Notes (Signed)
[place] [phone]   [phone]  CONSULT NOTE  REFERRING PHYSICIAN: Dr. Tamala Ser   REASON FOR CONSULTATION:  Leukocytopenia/ Neutropenia and mild anemia  HPI Brittany Bender is a 69 y.o. female with a past medical history significant for obstructive sleep apnea, history of breast cancer in 2000, asthma, tachycardia, hyperlipidemia, cervical spondylosis, epidermoid cyst, vitamin D deficiency, and vulvar ulceration is referred to the clinic for evaluation of leukocytopenia/neutropenia.  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  At that visit her WBCs were 3.2, she also had mild anemia with a hemoglobin of 11.6 which is normocytic.  Per chart review, it appears that the patient has had mild intermittent anemia since 2012 as well as mild intermittent  leukopenia/neutropenia since at least 2014.  The patient denies any new medications except prednisone, robaxin, gabapentin, and tramadol for her chronic back pain. She denies any over-the-counter supplements.  She denies any immunosuppressive drugs.  She denies any history of certain viral infections such as HIV or hepatitis.  She denies any history of autoimmune disorders.  She denies any signs and symptoms of infection including fever, nasal congestion, sore throat, cough, diarrhea, skin infections, dysuria, shortness of breath, etc.  She denies any chills, unexplained weight loss, or lymphadenopathy. She reports night sweats since being diagnosed with breast cancer in 2000. She denies any abnormal bleeding or bruising.  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  She denies any history of any nutrient deficiencies.  The patient denies any family history of any bone marrow or hematologic disorders.  The patient's mother passed away  from complications secondary to a car accident.  The patient's mother also had diabetes.  The patient's father had asthma.  The patient has 7 brothers and 2 sisters most of which have coronary artery disease, heart disease, diabetes, and health conditions secondary to smoking.  Patient is retired and she used to be a Pharmacist, [employer].  She is divorced and does not have any children.  She denies any history of drug use.  She states that she has approximately 3 alcoholic beverages per month.  She is a never smoker.   HPI  Past Medical History:  Diagnosis Date  . Allergy   . Anemia   . Anxiety    xanax used primarily for sleep   . Arthritis    all over, cervical spine- stenosis   . Asthma    last SOB was back in the summer, Never had "attack"  . Cancer [facility])    right breast cancer 2000-had radiation  . GERD (gastroesophageal reflux disease)   . Hypertension    prompted by having palpitations to see Dr,. Linard Millers, he only needs to follow up with her on PRN basis  . Personal history of radiation therapy 2000  . PONV (postoperative nausea and vomiting)   . Sleep apnea    has OSA- uses CPAP but not for last 2 weeks, due to sinus issues     Past Surgical History:  Procedure Laterality Date  . ABDOMINAL HYSTERECTOMY     2004  . ANTERIOR CERVICAL DECOMP/DISCECTOMY FUSION N/A 12/27/2013   Procedure: CERVICAL FOUR TO FIVE, CERVICAL FIVE TO SIX ANTERIOR CERVICAL DECOMPRESSION/DISCECTOMY FUSION 2 LEVELS;  Surgeon: Faythe Ghee, MD;  Location: [facility];  Service: Neurosurgery;  Laterality: N/A;  .  BACK SURGERY    . BREAST BIOPSY Left 09/07/2013   x2  . BREAST LUMPECTOMY Right 2000  . BREAST SURGERY     right lumpectomy 2000  . COLONOSCOPY    . FOOT GANGLION EXCISION     right and left at different times  . FOOT SURGERY     3 yrs ago broke left foot had hardware inserted and then had to have it removed  . 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 KNEE ARTHROSCOPY Left 2013  . LUMBAR LAMINECTOMY/DECOMPRESSION MICRODISCECTOMY  12/24/2010   Procedure: LUMBAR LAMINECTOMY/DECOMPRESSION MICRODISCECTOMY;  Surgeon: Otilio Connors;  Location: [facility];  Service: Neurosurgery;  Laterality: Right;  RIGHT LUMBAR FOUR-FIVE LAMINECTOMY/DISCECTOMY    Family History  Problem Relation Age of Onset  . Asthma Father   . Heart disease Sister        heart attack x 2  . Heart disease Brother        bypass  . Breast cancer Maternal Grandmother   . Colon cancer Neg Hx   . Colon polyps Neg Hx   . Esophageal cancer Neg Hx   . Rectal cancer Neg Hx   . Stomach cancer Neg Hx     Social History Social History   Tobacco Use  . Smoking status: Never Smoker  . Smokeless tobacco: Never Used  Substance Use Topics  . Alcohol use: Yes    Comment: occasional  . Drug use: No    No Known Allergies  Current Outpatient Medications  Medication Sig Dispense Refill  . albuterol (PROVENTIL HFA;VENTOLIN HFA) 108 (90 Base) MCG/ACT inhaler Inhale 2 puffs into the lungs every 6 (six) hours as needed. For shortness of breath. 8 g prn  . ALPRAZolam (XANAX) 0.5 MG tablet Take 1 tablet (0.5 mg total) by mouth at bedtime as needed. 90 tablet 1  . celecoxib (CELEBREX) 200 MG capsule Take 1 capsule (200 mg total) by mouth daily. 90 capsule 2  . ergocalciferol (DRISDOL) 1.25 MG (50000 UT) capsule Take 1 capsule (50,000 Units total) by mouth once a week. 12 capsule 3  . gabapentin (NEURONTIN) 100 MG capsule Take 1 capsule by mouth daily.    . Glucosamine HCl (GLUCOSAMINE PO) Take by mouth daily.    [name] ibuprofen (ADVIL,MOTRIN) 800 MG tablet Take 1 tablet (800 mg total) by mouth every 8 (eight) hours as needed. 60 tablet 1  . levocetirizine (XYZAL) 5 MG tablet take 1 tablet by mouth daily as needed for allergies 90 tablet 3  . LINZESS 290 MCG CAPS capsule TAKE ONE CAPSULE BY MOUTH DAILY BEFORE BREAKFAST 90 capsule 3  . methocarbamol (ROBAXIN) 500 MG  tablet Take 500 mg by mouth every 8 (eight) hours as needed for muscle spasms.    . metoprolol tartrate (LOPRESSOR) 25 MG tablet take 1 tablet by mouth 2 times daily 180 tablet 3  . montelukast (SINGULAIR) 10 MG tablet take 1 tablet by mouth daily as needed 90 tablet 3  . Multiple Vitamins-Minerals (MULTIVITAMINS THER. W/MINERALS) TABS Take 2 tablets by mouth daily.      [name] OVER THE COUNTER MEDICATION Take 1 tablet by mouth as needed. Quick digest    . predniSONE (STERAPRED UNI-PAK 21 TAB) 10 MG (21) TBPK tablet Take 10 mg by mouth as directed.    . rosuvastatin (CRESTOR) 5 MG tablet TAKE ONE TABLET BY MOUTH THREE TIMES A WEEK 90 tablet 0  . traMADol (ULTRAM)  50 MG tablet Take 50 mg by mouth every 6 (six) hours as needed.     No current facility-administered medications for this visit.    Review of Systems   Constitutional: Positive for night sweats for 20 years. Negative for appetite change, chills, fatigue, fever and unexpected weight change.  HENT:   Negative for mouth sores, nosebleeds, sore throat and trouble swallowing.  Eyes: Negative for eye problems and icterus.  Respiratory: Negative for cough, hemoptysis, shortness of breath and wheezing.   Cardiovascular: Negative for chest pain and leg swelling.  Gastrointestinal: Negative for abdominal pain, constipation, diarrhea, nausea and vomiting.  Genitourinary: Negative for bladder incontinence, difficulty urinating, dysuria, frequency and hematuria.   Musculoskeletal: Positive for chronic back pain. Negative for gait problem, neck pain and neck stiffness.  Skin: Negative for itching and rash.  Neurological: Negative for dizziness, extremity weakness, gait problem, headaches, light-headedness and seizures.  Hematological: Negative for adenopathy. Does not bruise/bleed easily.  Psychiatric/Behavioral: Negative for confusion, depression and sleep disturbance. The patient is not nervous/anxious.     PHYSICAL EXAMINATION:  Blood pressure  (!) 182/97, pulse 68, temperature 97.8 F (36.6 C), temperature source Tympanic, resp. rate 20, height 5\' 4"  (1.626 m), weight 264 lb 11.2 oz (120.1 kg), SpO2 100 %.  ECOG PERFORMANCE STATUS: 0  Physical Exam  Constitutional: Oriented to person, place, and time and well-developed, well-nourished, and in no distress. No distress.  HENT:  Head: Normocephalic and atraumatic.  Mouth/Throat: Oropharynx is clear and moist. No oropharyngeal exudate.  Eyes: Conjunctivae are normal. Right eye exhibits no discharge. Left eye exhibits no discharge. No scleral icterus.  Neck: Normal range of motion. Neck supple.  Cardiovascular: Normal rate, regular rhythm, normal heart sounds and intact distal pulses.   Pulmonary/Chest: Effort normal and breath sounds normal. No respiratory distress. No wheezes. No rales.  Abdominal: Soft. Bowel sounds are normal. Exhibits no distension and no mass. There is no tenderness.  Musculoskeletal: Normal range of motion. Exhibits no edema.  Lymphadenopathy:    No cervical adenopathy.  Neurological: Alert and oriented to person, place, and time. Exhibits normal muscle tone. Gait normal. Coordination normal.  Skin: Skin is warm and dry. No rash noted. Not diaphoretic. No erythema. No pallor.  Psychiatric: Mood, memory and judgment normal.  Vitals reviewed.  LABORATORY DATA: Lab Results  Component Value Date   WBC 6.0 09/21/2019   HGB 12.0 09/21/2019   HCT 36.3 09/21/2019   MCV 92.6 09/21/2019   PLT 225 09/21/2019      Chemistry      Component Value Date/Time   NA 142 09/21/2019 0941   K 3.3 (L) 09/21/2019 0941   CL 109 09/21/2019 0941   CO2 24 09/21/2019 0941   BUN 15 09/21/2019 0941   CREATININE 0.79 09/21/2019 0941   CREATININE 0.76 06/10/2019 0940      Component Value Date/Time   CALCIUM 9.2 09/21/2019 0941   ALKPHOS 71 09/21/2019 0941   AST 11 (L) 09/21/2019 0941   ALT 8 09/21/2019 0941   BILITOT 0.4 09/21/2019 0941       RADIOGRAPHIC  STUDIES: No results found.  ASSESSMENT: This is a very pleasant 69 year old African-American female referred to the clinic for evaluation of leukopenia/neutropenia and mild normocytic anemia.   PLAN: The patient was seen with Dr. Julien Nordmann today.  The patient several lab studies performed including a CBC, CMP, LDH, folate, ferritin, iron, vitamin B12, hepatitis panel, HIV, rheumatoid factor, and ANA performed.  The patient CBC was unremarkable today.  Her WBC was 6.0. Hgb WNL at 12.  Her CMP was unremarkable except for mild hypokalemia with a potassium at 3.3. She was instructed to increase her intake of potassium rich foods. Her other lab studies are still pending at this time.   Dr. Julien Nordmann believes this will likely be reactive; however, we will call the patient if any of her pending lab studies are abnormal and if they require further evaluation/management.   We will not schedule any follow up appointments at this time and she can continue to follow with her PCP for routine blood work. We would be happy to see the patient on an as needed basis.   The patient voices understanding of current disease status and treatment options and is in agreement with the current care plan.  All questions were answered. The patient knows to call the clinic with any problems, questions or concerns. We can certainly see the patient much sooner if necessary.  Thank you so much for allowing me to participate in the care of Brittany Bender. I will continue to follow up the patient with you and assist in her care.  The total time spent in the appointment was 60 minutes.  Disclaimer: This note was dictated with voice recognition software. Similar sounding words can inadvertently be transcribed and may not be corrected upon review.   Marek Nghiem L Alexsandria Kivett September 21, 2019, 11:03 AM  ADDENDUM: Hematology/Oncology Attending: I had a face-to-face encounter with the patient.  I recommended her care plan.  This is  a very pleasant 69 years old African-American female with past medical history significant for breast cancer in 2000, asthma, dyslipidemia, cervical spondylosis.  The patient was seen recently by her primary care physician Dr. Renold Genta and during her visit she had CBC performed that showed mild leukocytopenia and neutropenia in addition to mild normocytic anemia.  She has a history of anemia since 2012.  She also has mild intermittent leukocytopenia for the last 7 years or so. She was referred to [place] today for evaluation and recommendation regarding her leukocytopenia.  We will order several studies today for evaluation of her leukocytopenia including repeat CBC, comprehensive metabolic panel, serum folate, vitamin B12 level, ANA, HIV, hepatitis panel, rheumatoid factor.  Repeat CBC today showed normal white blood count of 6.0.  The patient also has normal hemoglobin and hematocrit as well as platelets count.  Hepatitis panel showed positive hepatitis A IgM concerning for possible recent infection.  We will send the report to her primary care physician but the patient is currently asymptomatic.  Her serum folate is also low and we will advised the patient to start taking oral folic acid. We will see her on as-needed basis at this point. The patient was advised to call immediately if she has any concerning symptoms in the interval.  Disclaimer: This note was dictated with voice recognition software. Similar sounding words can inadvertently be transcribed and may be missed upon review. Eilleen Kempf, MD 09/21/19

## 2019-09-21 ENCOUNTER — Inpatient Hospital Stay: Payer: Medicare PPO | Attending: Physician Assistant | Admitting: Physician Assistant

## 2019-09-21 ENCOUNTER — Telehealth: Payer: Self-pay | Admitting: Physician Assistant

## 2019-09-21 ENCOUNTER — Encounter: Payer: Self-pay | Admitting: Physician Assistant

## 2019-09-21 ENCOUNTER — Telehealth: Payer: Self-pay | Admitting: Internal Medicine

## 2019-09-21 ENCOUNTER — Inpatient Hospital Stay: Payer: Medicare PPO

## 2019-09-21 ENCOUNTER — Other Ambulatory Visit: Payer: Self-pay

## 2019-09-21 ENCOUNTER — Other Ambulatory Visit: Payer: Self-pay | Admitting: Physician Assistant

## 2019-09-21 DIAGNOSIS — D72819 Decreased white blood cell count, unspecified: Secondary | ICD-10-CM | POA: Diagnosis not present

## 2019-09-21 DIAGNOSIS — F419 Anxiety disorder, unspecified: Secondary | ICD-10-CM | POA: Insufficient documentation

## 2019-09-21 DIAGNOSIS — K219 Gastro-esophageal reflux disease without esophagitis: Secondary | ICD-10-CM | POA: Insufficient documentation

## 2019-09-21 DIAGNOSIS — I2510 Atherosclerotic heart disease of native coronary artery without angina pectoris: Secondary | ICD-10-CM | POA: Insufficient documentation

## 2019-09-21 DIAGNOSIS — D709 Neutropenia, unspecified: Secondary | ICD-10-CM

## 2019-09-21 DIAGNOSIS — E785 Hyperlipidemia, unspecified: Secondary | ICD-10-CM | POA: Diagnosis not present

## 2019-09-21 DIAGNOSIS — D649 Anemia, unspecified: Secondary | ICD-10-CM | POA: Insufficient documentation

## 2019-09-21 DIAGNOSIS — Z923 Personal history of irradiation: Secondary | ICD-10-CM | POA: Insufficient documentation

## 2019-09-21 DIAGNOSIS — E119 Type 2 diabetes mellitus without complications: Secondary | ICD-10-CM | POA: Insufficient documentation

## 2019-09-21 DIAGNOSIS — M1990 Unspecified osteoarthritis, unspecified site: Secondary | ICD-10-CM | POA: Diagnosis not present

## 2019-09-21 DIAGNOSIS — Z79899 Other long term (current) drug therapy: Secondary | ICD-10-CM | POA: Insufficient documentation

## 2019-09-21 DIAGNOSIS — Z853 Personal history of malignant neoplasm of breast: Secondary | ICD-10-CM | POA: Insufficient documentation

## 2019-09-21 DIAGNOSIS — I10 Essential (primary) hypertension: Secondary | ICD-10-CM | POA: Diagnosis not present

## 2019-09-21 DIAGNOSIS — E876 Hypokalemia: Secondary | ICD-10-CM | POA: Insufficient documentation

## 2019-09-21 DIAGNOSIS — G4733 Obstructive sleep apnea (adult) (pediatric): Secondary | ICD-10-CM | POA: Insufficient documentation

## 2019-09-21 LAB — CMP (CANCER CENTER ONLY)
ALT: 8 U/L (ref 0–44)
AST: 11 U/L — ABNORMAL LOW (ref 15–41)
Albumin: 3.5 g/dL (ref 3.5–5.0)
Alkaline Phosphatase: 71 U/L (ref 38–126)
Anion gap: 9 (ref 5–15)
BUN: 15 mg/dL (ref 8–23)
CO2: 24 mmol/L (ref 22–32)
Calcium: 9.2 mg/dL (ref 8.9–10.3)
Chloride: 109 mmol/L (ref 98–111)
Creatinine: 0.79 mg/dL (ref 0.44–1.00)
GFR, Est AFR Am: >60 mL/min (ref >60)
GFR, Estimated: >60 mL/min (ref >60)
Glucose, Bld: 96 mg/dL (ref 70–99)
Potassium: 3.3 mmol/L — ABNORMAL LOW (ref 3.5–5.1)
Sodium: 142 mmol/L (ref 135–145)
Total Bilirubin: 0.4 mg/dL (ref 0.3–1.2)
Total Protein: 7.7 g/dL (ref 6.5–8.1)

## 2019-09-21 LAB — CBC WITH DIFFERENTIAL (CANCER CENTER ONLY)
Abs Immature Granulocytes: 0.04 10*3/uL (ref 0.00–0.07)
Basophils Absolute: 0 10*3/uL (ref 0.0–0.1)
Basophils Relative: 0 %
Eosinophils Absolute: 0 10*3/uL (ref 0.0–0.5)
Eosinophils Relative: 0 %
HCT: 36.3 % (ref 36.0–46.0)
Hemoglobin: 12 g/dL (ref 12.0–15.0)
Immature Granulocytes: 1 %
Lymphocytes Relative: 38 %
Lymphs Abs: 2.3 10*3/uL (ref 0.7–4.0)
MCH: 30.6 pg (ref 26.0–34.0)
MCHC: 33.1 g/dL (ref 30.0–36.0)
MCV: 92.6 fL (ref 80.0–100.0)
Monocytes Absolute: 0.5 10*3/uL (ref 0.1–1.0)
Monocytes Relative: 8 %
Neutro Abs: 3.2 10*3/uL (ref 1.7–7.7)
Neutrophils Relative %: 53 %
Platelet Count: 225 10*3/uL (ref 150–400)
RBC: 3.92 MIL/uL (ref 3.87–5.11)
RDW: 14.4 % (ref 11.5–15.5)
WBC Count: 6 10*3/uL (ref 4.0–10.5)
nRBC: 0 % (ref 0.0–0.2)

## 2019-09-21 LAB — FERRITIN: Ferritin: 127 ng/mL (ref 11–307)

## 2019-09-21 LAB — HIV ANTIBODY (ROUTINE TESTING W REFLEX): HIV Screen 4th Generation wRfx: NONREACTIVE

## 2019-09-21 LAB — FOLATE: Folate: 4.6 ng/mL — ABNORMAL LOW (ref >5.9)

## 2019-09-21 LAB — IRON AND TIBC
Iron: 57 ug/dL (ref 41–142)
Saturation Ratios: 22 % (ref 21–57)
TIBC: 264 ug/dL (ref 236–444)
UIBC: 206 ug/dL (ref 120–384)

## 2019-09-21 LAB — VITAMIN B12: Vitamin B-12: 350 pg/mL (ref 180–914)

## 2019-09-21 LAB — LACTATE DEHYDROGENASE: LDH: 185 U/L (ref 98–192)

## 2019-09-21 NOTE — Telephone Encounter (Signed)
Schedule visit next week to discuss.

## 2019-09-21 NOTE — Telephone Encounter (Signed)
Maudie Mercury - RN, [place] called to say that Cassandra Hellingoetter, PA wanted you to know that patients Hep A came back Reactive. She is concerned how this may effect her liver.

## 2019-09-21 NOTE — Telephone Encounter (Signed)
I spoke to the patient about her lab studies from today. Her Hepatitis IgM anti-body was reactive. The patient denies any recent exposures or drinking/eating seafood or contaminated water. She states she drinks bottled water. She is asymptomatic. Reviewed signs and symptoms with her. Discussed that I would pass this on to her PCP's office to see what their recommendations are for monitoring. Also recommend that she follow up with her PCP to avoid non-essential medications which are metabolized by the liver. She expressed understanding with the plan.

## 2019-09-21 NOTE — Telephone Encounter (Signed)
Scheduled

## 2019-09-22 ENCOUNTER — Other Ambulatory Visit: Payer: Self-pay | Admitting: Physician Assistant

## 2019-09-22 ENCOUNTER — Telehealth: Payer: Self-pay | Admitting: Physician Assistant

## 2019-09-22 DIAGNOSIS — E538 Deficiency of other specified B group vitamins: Secondary | ICD-10-CM

## 2019-09-22 LAB — HEPATITIS PANEL, ACUTE
HCV Ab: NONREACTIVE
Hep A IgM: REACTIVE — AB
Hep B C IgM: NONREACTIVE
Hepatitis B Surface Ag: NONREACTIVE

## 2019-09-22 LAB — RHEUMATOID FACTOR: Rheumatoid fact SerPl-aCnc: 23 IU/mL — ABNORMAL HIGH (ref 0.0–13.9)

## 2019-09-22 LAB — ANTINUCLEAR ANTIBODIES, IFA: ANA Ab, IFA: NEGATIVE

## 2019-09-22 MED ORDER — FOLIC ACID 1 MG PO TABS: 1.0000 mg | ORAL_TABLET | Freq: Every day | ORAL | 3 refills | Status: DC

## 2019-09-22 NOTE — Telephone Encounter (Signed)
I called the patient to let her know her folate was low. I am sending a folic acid supplement to her pharmacy. She expressed understanding.

## 2019-09-22 NOTE — Progress Notes (Signed)
Please call her. Have her come Friday for lab only because [facility] called about a positive Hepatitis A antibody which she may have had for months or years. I want her to have on Friday ahead of her Monday visit: Hepatitis A IgG antibody, Hepatitis A IgM antibody so we can get to the bottom of this on Monday.

## 2019-09-23 DIAGNOSIS — M5416 Radiculopathy, lumbar region: Secondary | ICD-10-CM | POA: Diagnosis not present

## 2019-09-23 NOTE — Progress Notes (Signed)
Scheduled lab

## 2019-09-24 ENCOUNTER — Other Ambulatory Visit: Payer: Medicare PPO | Admitting: Internal Medicine

## 2019-09-24 ENCOUNTER — Other Ambulatory Visit: Payer: Self-pay

## 2019-09-24 DIAGNOSIS — B159 Hepatitis A without hepatic coma: Secondary | ICD-10-CM

## 2019-09-27 ENCOUNTER — Encounter: Payer: Self-pay | Admitting: Internal Medicine

## 2019-09-27 ENCOUNTER — Ambulatory Visit: Payer: Medicare PPO | Admitting: Internal Medicine

## 2019-09-27 ENCOUNTER — Other Ambulatory Visit: Payer: Self-pay

## 2019-09-27 VITALS — BP 120/90 | HR 85 | Ht 64.0 in | Wt 258.0 lb

## 2019-09-27 DIAGNOSIS — B159 Hepatitis A without hepatic coma: Secondary | ICD-10-CM

## 2019-09-27 DIAGNOSIS — E538 Deficiency of other specified B group vitamins: Secondary | ICD-10-CM | POA: Diagnosis not present

## 2019-09-27 DIAGNOSIS — R030 Elevated blood-pressure reading, without diagnosis of hypertension: Secondary | ICD-10-CM

## 2019-09-27 LAB — HEPATITIS A ANTIBODY, IGM: Hep A IgM: BORDERLINE — AB

## 2019-09-27 NOTE — Progress Notes (Signed)
   Subjective:    Patient ID: Brittany Bender, female    DOB: [birth year], 69 y.o.   MRN: [id number]  HPI 69 year old Female recently found to have positive Hepatitis A IgM antibody panel at time of Hematology consultation for evaluation of neutropenia/leukopenia.  She has had mild intermittent anemia for a number of years.  She has no history of autoimmune disorder.  She has remote history of breast cancer.  There is no family history of bone marrow or blood disorders. Evaluation by Hematology revealed low folate at 4.6.  Patient now is taking folate supplement.  B12 level is 350 and will need to be monitored annually.  Iron level was normal at 57.  Patient has had extensive work up recently at [facility]. Does not have elevated LFTs. Review of Systems She feels well and has no new complaints    Objective:   Physical Exam  Patient is anxious today and her blood pressure is elevated at 182/97.  She will continue to monitor her blood pressure at home.  Pulse is 68.  Pulse oximetry 100%.      Assessment & Plan:  Positive Hepatitis A IgM antibody-according to Up-to-date patients can sometimes have hepatitis A IgM antibody present for months or years.  She does not recall any episode recently where she has been sick with nausea vomiting or develops scleral icterus.  It is my feeling that this is a remote case and she has had an antibiotic for some time.  We will repeat this today to [facility] lab.  Patient is agreeable to this repeat testing.  Apparently an IgG antibody is not available for Hepatitis A.  Folate deficiency-continue folate supplement and follow-up at next visit  Elevated blood pressure reading-continue to monitor blood pressure at home and let me know if persistently elevated.

## 2019-09-27 NOTE — Patient Instructions (Addendum)
It was a pleasure to see you today.  We are repeating Hepatitis IgG antibody to confirm previous results.  This is something you have likely had for some period of time and you have obviously cleared the illness and it was obviously minor and self-limited as you do not recall any symptoms or jaundice.  It should have no long-term consequences.  Continue folate supplementation and we will follow-up with folate level in about a month.  We are delighted that there was no issue with neutropenia.  Continue to monitor your blood pressure at home and let us know if persistently elevated.

## 2019-09-28 LAB — HEPATITIS A ANTIBODY, IGM: Hep A IgM: BORDERLINE — AB

## 2019-10-06 DIAGNOSIS — M5416 Radiculopathy, lumbar region: Secondary | ICD-10-CM | POA: Diagnosis not present

## 2019-10-08 DIAGNOSIS — M5416 Radiculopathy, lumbar region: Secondary | ICD-10-CM | POA: Diagnosis not present

## 2019-10-11 DIAGNOSIS — M5416 Radiculopathy, lumbar region: Secondary | ICD-10-CM | POA: Diagnosis not present

## 2019-10-14 ENCOUNTER — Ambulatory Visit
Admission: RE | Admit: 2019-10-14 | Discharge: 2019-10-14 | Disposition: A | Discharge status: Home / Self Care | Payer: Medicare PPO | Source: Ambulatory Visit | Attending: Obstetrics | Admitting: Obstetrics

## 2019-10-14 ENCOUNTER — Other Ambulatory Visit: Payer: Self-pay

## 2019-10-14 DIAGNOSIS — Z1231 Encounter for screening mammogram for malignant neoplasm of breast: Secondary | ICD-10-CM | POA: Diagnosis not present

## 2019-10-22 DIAGNOSIS — M5416 Radiculopathy, lumbar region: Secondary | ICD-10-CM | POA: Diagnosis not present

## 2019-10-28 ENCOUNTER — Encounter: Payer: Self-pay | Admitting: Internal Medicine

## 2019-10-28 ENCOUNTER — Other Ambulatory Visit: Payer: Self-pay

## 2019-10-28 ENCOUNTER — Ambulatory Visit (INDEPENDENT_AMBULATORY_CARE_PROVIDER_SITE_OTHER): Payer: Medicare PPO | Admitting: Internal Medicine

## 2019-10-28 VITALS — BP 120/70 | HR 86 | Temp 98.0°F | Ht 64.0 in | Wt 258.0 lb

## 2019-10-28 DIAGNOSIS — Z23 Encounter for immunization: Secondary | ICD-10-CM | POA: Diagnosis not present

## 2019-10-28 DIAGNOSIS — E538 Deficiency of other specified B group vitamins: Secondary | ICD-10-CM

## 2019-10-28 LAB — FOLATE: Folate: >24 ng/mL

## 2019-10-28 NOTE — Progress Notes (Signed)
Flu vaccine and lab draw only.

## 2019-10-28 NOTE — Patient Instructions (Signed)
Patient received a flu vaccine IM L deltoid, AV, CMA  

## 2020-02-02 ENCOUNTER — Other Ambulatory Visit: Payer: Self-pay | Admitting: Internal Medicine

## 2020-02-02 NOTE — Telephone Encounter (Signed)
CPE due in May. Please book before refilling these

## 2020-02-04 ENCOUNTER — Other Ambulatory Visit: Payer: Self-pay | Admitting: Physician Assistant

## 2020-02-04 ENCOUNTER — Other Ambulatory Visit: Payer: Self-pay | Admitting: Internal Medicine

## 2020-02-04 DIAGNOSIS — E538 Deficiency of other specified B group vitamins: Secondary | ICD-10-CM

## 2020-02-04 NOTE — Telephone Encounter (Signed)
Please sign [facility]ANA[facility].

## 2020-02-10 ENCOUNTER — Other Ambulatory Visit: Payer: Self-pay | Admitting: Physician Assistant

## 2020-02-10 DIAGNOSIS — E538 Deficiency of other specified B group vitamins: Secondary | ICD-10-CM

## 2020-02-18 ENCOUNTER — Other Ambulatory Visit: Payer: Self-pay | Admitting: Physician Assistant

## 2020-02-18 DIAGNOSIS — E538 Deficiency of other specified B group vitamins: Secondary | ICD-10-CM

## 2020-02-21 ENCOUNTER — Other Ambulatory Visit: Payer: Self-pay | Admitting: Physician Assistant

## 2020-02-21 DIAGNOSIS — E538 Deficiency of other specified B group vitamins: Secondary | ICD-10-CM

## 2020-03-11 ENCOUNTER — Other Ambulatory Visit: Payer: Self-pay | Admitting: Internal Medicine

## 2020-03-24 DIAGNOSIS — Z03818 Encounter for observation for suspected exposure to other biological agents ruled out: Secondary | ICD-10-CM | POA: Diagnosis not present

## 2020-03-24 DIAGNOSIS — Z20822 Contact with and (suspected) exposure to covid-19: Secondary | ICD-10-CM | POA: Diagnosis not present

## 2020-06-13 ENCOUNTER — Other Ambulatory Visit: Payer: Self-pay

## 2020-06-13 ENCOUNTER — Other Ambulatory Visit: Payer: Medicare PPO | Admitting: Internal Medicine

## 2020-06-13 DIAGNOSIS — I5189 Other ill-defined heart diseases: Secondary | ICD-10-CM | POA: Diagnosis not present

## 2020-06-13 DIAGNOSIS — J302 Other seasonal allergic rhinitis: Secondary | ICD-10-CM

## 2020-06-13 DIAGNOSIS — G4733 Obstructive sleep apnea (adult) (pediatric): Secondary | ICD-10-CM | POA: Diagnosis not present

## 2020-06-13 DIAGNOSIS — E538 Deficiency of other specified B group vitamins: Secondary | ICD-10-CM | POA: Diagnosis not present

## 2020-06-13 DIAGNOSIS — K5909 Other constipation: Secondary | ICD-10-CM

## 2020-06-13 DIAGNOSIS — B159 Hepatitis A without hepatic coma: Secondary | ICD-10-CM

## 2020-06-13 DIAGNOSIS — Z0000 Encounter for general adult medical examination without abnormal findings: Secondary | ICD-10-CM | POA: Diagnosis not present

## 2020-06-13 DIAGNOSIS — R7302 Impaired glucose tolerance (oral): Secondary | ICD-10-CM

## 2020-06-13 DIAGNOSIS — E7800 Pure hypercholesterolemia, unspecified: Secondary | ICD-10-CM

## 2020-06-14 LAB — HEMOGLOBIN A1C
Hgb A1c MFr Bld: 5.7 % of total Hgb — ABNORMAL HIGH (ref <5.7)
Mean Plasma Glucose: 117 mg/dL
eAG (mmol/L): 6.5 mmol/L

## 2020-06-14 LAB — CBC WITH DIFFERENTIAL/PLATELET
Absolute Monocytes: 252 cells/uL (ref 200–950)
Basophils Absolute: 20 cells/uL (ref 0–200)
Basophils Relative: 0.6 %
Eosinophils Absolute: 92 cells/uL (ref 15–500)
Eosinophils Relative: 2.7 %
HCT: 36.8 % (ref 35.0–45.0)
Hemoglobin: 11.9 g/dL (ref 11.7–15.5)
Lymphs Abs: 1805 cells/uL (ref 850–3900)
MCH: 29.7 pg (ref 27.0–33.0)
MCHC: 32.3 g/dL (ref 32.0–36.0)
MCV: 91.8 fL (ref 80.0–100.0)
MPV: 10.1 fL (ref 7.5–12.5)
Monocytes Relative: 7.4 %
Neutro Abs: 1231 cells/uL — ABNORMAL LOW (ref 1500–7800)
Neutrophils Relative %: 36.2 %
Platelets: 233 10*3/uL (ref 140–400)
RBC: 4.01 10*6/uL (ref 3.80–5.10)
RDW: 14.2 % (ref 11.0–15.0)
Total Lymphocyte: 53.1 %
WBC: 3.4 10*3/uL — ABNORMAL LOW (ref 3.8–10.8)

## 2020-06-14 LAB — LIPID PANEL
Cholesterol: 178 mg/dL (ref <200)
HDL: 73 mg/dL (ref >=50)
LDL Cholesterol (Calc): 87 mg/dL (calc)
Non-HDL Cholesterol (Calc): 105 mg/dL (calc) (ref <130)
Total CHOL/HDL Ratio: 2.4 (calc) (ref <5.0)
Triglycerides: 87 mg/dL (ref <150)

## 2020-06-14 LAB — COMPLETE METABOLIC PANEL WITH GFR
AG Ratio: 1.2 (calc) (ref 1.0–2.5)
ALT: 10 U/L (ref 6–29)
AST: 17 U/L (ref 10–35)
Albumin: 4.1 g/dL (ref 3.6–5.1)
Alkaline phosphatase (APISO): 68 U/L (ref 37–153)
BUN: 11 mg/dL (ref 7–25)
CO2: 25 mmol/L (ref 20–32)
Calcium: 8.9 mg/dL (ref 8.6–10.4)
Chloride: 106 mmol/L (ref 98–110)
Creat: 0.7 mg/dL (ref 0.50–0.99)
GFR, Est African American: 103 mL/min/{1.73_m2} (ref >=60)
GFR, Est Non African American: 89 mL/min/{1.73_m2} (ref >=60)
Globulin: 3.4 g/dL (calc) (ref 1.9–3.7)
Glucose, Bld: 93 mg/dL (ref 65–99)
Potassium: 3.6 mmol/L (ref 3.5–5.3)
Sodium: 140 mmol/L (ref 135–146)
Total Bilirubin: 0.4 mg/dL (ref 0.2–1.2)
Total Protein: 7.5 g/dL (ref 6.1–8.1)

## 2020-06-14 LAB — TSH: TSH: 2.77 mIU/L (ref 0.40–4.50)

## 2020-06-15 ENCOUNTER — Ambulatory Visit (INDEPENDENT_AMBULATORY_CARE_PROVIDER_SITE_OTHER): Payer: Medicare PPO | Admitting: Internal Medicine

## 2020-06-15 ENCOUNTER — Encounter: Payer: Self-pay | Admitting: Internal Medicine

## 2020-06-15 ENCOUNTER — Other Ambulatory Visit: Payer: Self-pay

## 2020-06-15 VITALS — BP 110/80 | HR 90 | Ht 64.0 in | Wt 254.0 lb

## 2020-06-15 DIAGNOSIS — Z8659 Personal history of other mental and behavioral disorders: Secondary | ICD-10-CM

## 2020-06-15 DIAGNOSIS — E538 Deficiency of other specified B group vitamins: Secondary | ICD-10-CM | POA: Diagnosis not present

## 2020-06-15 DIAGNOSIS — R7302 Impaired glucose tolerance (oral): Secondary | ICD-10-CM | POA: Diagnosis not present

## 2020-06-15 DIAGNOSIS — K5909 Other constipation: Secondary | ICD-10-CM

## 2020-06-15 DIAGNOSIS — E559 Vitamin D deficiency, unspecified: Secondary | ICD-10-CM

## 2020-06-15 DIAGNOSIS — Z853 Personal history of malignant neoplasm of breast: Secondary | ICD-10-CM | POA: Diagnosis not present

## 2020-06-15 DIAGNOSIS — E7800 Pure hypercholesterolemia, unspecified: Secondary | ICD-10-CM | POA: Diagnosis not present

## 2020-06-15 DIAGNOSIS — Z6841 Body Mass Index (BMI) 40.0 and over, adult: Secondary | ICD-10-CM

## 2020-06-15 DIAGNOSIS — G4733 Obstructive sleep apnea (adult) (pediatric): Secondary | ICD-10-CM | POA: Diagnosis not present

## 2020-06-15 DIAGNOSIS — J302 Other seasonal allergic rhinitis: Secondary | ICD-10-CM

## 2020-06-15 DIAGNOSIS — I5189 Other ill-defined heart diseases: Secondary | ICD-10-CM

## 2020-06-15 DIAGNOSIS — Z0000 Encounter for general adult medical examination without abnormal findings: Secondary | ICD-10-CM

## 2020-06-15 DIAGNOSIS — Z23 Encounter for immunization: Secondary | ICD-10-CM

## 2020-06-15 DIAGNOSIS — J4520 Mild intermittent asthma, uncomplicated: Secondary | ICD-10-CM

## 2020-06-15 DIAGNOSIS — Z8679 Personal history of other diseases of the circulatory system: Secondary | ICD-10-CM

## 2020-06-15 LAB — POCT URINALYSIS DIPSTICK
Appearance: NEGATIVE
Bilirubin, UA: NEGATIVE
Blood, UA: NEGATIVE
Glucose, UA: NEGATIVE
Ketones, UA: NEGATIVE
Leukocytes, UA: NEGATIVE
Nitrite, UA: NEGATIVE
Odor: NEGATIVE
Protein, UA: NEGATIVE
Spec Grav, UA: 1.01 (ref 1.010–1.025)
Urobilinogen, UA: 0.2 E.U./dL
pH, UA: 6.5 (ref 5.0–8.0)

## 2020-06-15 MED ORDER — FOLIC ACID 1 MG PO TABS: 1.0000 mg | ORAL_TABLET | Freq: Every day | ORAL | 3 refills | Status: DC
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
## 2020-06-17 NOTE — Patient Instructions (Signed)
Pneumococcal 23 vaccine given today.  It was a pleasure to see you.  Continue current medications and return in 1 year or as needed.  COVID-19 vaccines are up-to-date.  Had flu vaccine in the fall.  Colonoscopy is up-to-date as is mammogram.  Consider [facility] for weight loss.

## 2020-07-04 DIAGNOSIS — Z111 Encounter for screening for respiratory tuberculosis: Secondary | ICD-10-CM | POA: Diagnosis not present

## 2020-07-04 LAB — TB SKIN TEST
Induration: 0 mm
TB Skin Test: NEGATIVE

## 2020-07-06 DIAGNOSIS — Z111 Encounter for screening for respiratory tuberculosis: Secondary | ICD-10-CM | POA: Diagnosis not present

## 2020-07-12 DIAGNOSIS — Z0289 Encounter for other administrative examinations: Secondary | ICD-10-CM

## 2020-07-14 ENCOUNTER — Ambulatory Visit (INDEPENDENT_AMBULATORY_CARE_PROVIDER_SITE_OTHER): Payer: Medicare PPO | Admitting: Internal Medicine

## 2020-07-14 ENCOUNTER — Other Ambulatory Visit: Payer: Self-pay

## 2020-07-14 ENCOUNTER — Encounter: Payer: Self-pay | Admitting: Internal Medicine

## 2020-07-14 VITALS — Ht 64.0 in

## 2020-07-14 DIAGNOSIS — Z23 Encounter for immunization: Secondary | ICD-10-CM | POA: Diagnosis not present

## 2020-07-14 NOTE — Progress Notes (Signed)
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 Copy of form is in chart. [email or web address]

## 2020-07-15 NOTE — Patient Instructions (Signed)
Tdap injection given today for upcoming employment

## 2020-08-08 ENCOUNTER — Other Ambulatory Visit: Payer: Self-pay | Admitting: Internal Medicine

## 2020-08-13 ENCOUNTER — Other Ambulatory Visit: Payer: Self-pay | Admitting: Internal Medicine

## 2020-08-20 ENCOUNTER — Other Ambulatory Visit: Payer: Self-pay | Admitting: Internal Medicine

## 2020-09-04 ENCOUNTER — Other Ambulatory Visit: Payer: Self-pay | Admitting: Internal Medicine

## 2020-09-05 ENCOUNTER — Other Ambulatory Visit: Payer: Self-pay

## 2020-09-05 MED ORDER — METOPROLOL TARTRATE 25 MG PO TABS: 25.0000 mg | ORAL_TABLET | Freq: Two times a day (BID) | ORAL | 0 refills | Status: DC

## 2020-09-25 DIAGNOSIS — H40013 Open angle with borderline findings, low risk, bilateral: Secondary | ICD-10-CM | POA: Diagnosis not present

## 2020-10-11 ENCOUNTER — Other Ambulatory Visit: Payer: Self-pay | Admitting: Internal Medicine

## 2020-10-11 DIAGNOSIS — Z1231 Encounter for screening mammogram for malignant neoplasm of breast: Secondary | ICD-10-CM

## 2020-10-19 DIAGNOSIS — Z23 Encounter for immunization: Secondary | ICD-10-CM | POA: Diagnosis not present

## 2020-11-17 ENCOUNTER — Other Ambulatory Visit: Payer: Self-pay

## 2020-11-17 ENCOUNTER — Ambulatory Visit
Admission: RE | Admit: 2020-11-17 | Discharge: 2020-11-17 | Disposition: A | Discharge status: Home / Self Care | Payer: Medicare PPO | Source: Ambulatory Visit | Attending: Internal Medicine | Admitting: Internal Medicine

## 2020-11-17 DIAGNOSIS — Z1231 Encounter for screening mammogram for malignant neoplasm of breast: Secondary | ICD-10-CM

## 2020-11-19 ENCOUNTER — Other Ambulatory Visit: Payer: Self-pay | Admitting: Internal Medicine

## 2020-11-20 ENCOUNTER — Ambulatory Visit (INDEPENDENT_AMBULATORY_CARE_PROVIDER_SITE_OTHER): Payer: Medicare PPO

## 2020-11-20 ENCOUNTER — Other Ambulatory Visit: Payer: Self-pay

## 2020-11-20 DIAGNOSIS — Z23 Encounter for immunization: Secondary | ICD-10-CM | POA: Diagnosis not present

## 2020-11-22 ENCOUNTER — Other Ambulatory Visit: Payer: Self-pay | Admitting: Internal Medicine

## 2020-11-22 DIAGNOSIS — R928 Other abnormal and inconclusive findings on diagnostic imaging of breast: Secondary | ICD-10-CM

## 2020-12-13 ENCOUNTER — Ambulatory Visit
Admission: RE | Admit: 2020-12-13 | Discharge: 2020-12-13 | Disposition: A | Discharge status: Home / Self Care | Payer: Medicare PPO | Source: Ambulatory Visit | Attending: Internal Medicine | Admitting: Internal Medicine

## 2020-12-13 ENCOUNTER — Ambulatory Visit: Admission: RE | Admit: 2020-12-13 | Payer: Medicare PPO | Source: Ambulatory Visit

## 2020-12-13 DIAGNOSIS — R922 Inconclusive mammogram: Secondary | ICD-10-CM | POA: Diagnosis not present

## 2020-12-13 DIAGNOSIS — R928 Other abnormal and inconclusive findings on diagnostic imaging of breast: Secondary | ICD-10-CM

## 2020-12-24 ENCOUNTER — Other Ambulatory Visit: Payer: Self-pay | Admitting: Internal Medicine

## 2021-01-04 ENCOUNTER — Encounter: Payer: Self-pay | Admitting: Internal Medicine

## 2021-01-04 ENCOUNTER — Other Ambulatory Visit: Payer: Self-pay

## 2021-01-04 ENCOUNTER — Other Ambulatory Visit: Payer: Self-pay | Admitting: Internal Medicine

## 2021-01-04 ENCOUNTER — Telehealth (INDEPENDENT_AMBULATORY_CARE_PROVIDER_SITE_OTHER): Payer: Medicare PPO | Admitting: Internal Medicine

## 2021-01-04 ENCOUNTER — Telehealth: Payer: Self-pay | Admitting: Internal Medicine

## 2021-01-04 VITALS — BP 149/89 | Temp 96.1°F

## 2021-01-04 DIAGNOSIS — J22 Unspecified acute lower respiratory infection: Secondary | ICD-10-CM | POA: Diagnosis not present

## 2021-01-04 MED ORDER — HYDROCODONE BIT-HOMATROP MBR 5-1.5 MG/5ML PO SOLN: 5.0000 mL | Freq: Three times a day (TID) | ORAL | 0 refills | Status: DC | PRN

## 2021-01-04 MED ORDER — AZITHROMYCIN 250 MG PO TABS: ORAL_TABLET | ORAL | 1 refills | Status: DC

## 2021-01-04 MED ORDER — AZITHROMYCIN 250 MG PO TABS: ORAL_TABLET | ORAL | 0 refills | Status: AC

## 2021-01-04 NOTE — Telephone Encounter (Signed)
scheduled

## 2021-01-04 NOTE — Telephone Encounter (Signed)
Called patient to have her do COVID test and call back.

## 2021-01-04 NOTE — Telephone Encounter (Signed)
Called patient to let her know that medicine has been sent to right pharmacy

## 2021-01-04 NOTE — Progress Notes (Signed)
Patient was told an antibiotic woul be sent in but nothing has been sent yet. Please advise. [place].

## 2021-01-04 NOTE — Patient Instructions (Addendum)
Please take another COVID test tomorrow although the one was negative today that she took already.  Take Zithromax Z-PAK 2 tabs day 1 followed by 1 tab days 2 through 5.  Hycodan 1 teaspoon every 8 hours as needed for cough.  Walk some to prevent atelectasis.  Monitor pulse oximetry.  Stay well-hydrated.  Call if not better in 48 hours or sooner if worse.  Have COVID booster once you are over this current illness if you continue to test negative for COVID.  We do not see where you have had a Fall COVID booster.

## 2021-01-04 NOTE — Telephone Encounter (Signed)
Please resend to [employer]

## 2021-01-04 NOTE — Progress Notes (Signed)
   Subjective:    Patient ID: Brittany Bender, female    DOB: 1950/09/02, 70 y.o.   MRN: [id number]  HPI 70 year old Female seen for 5 day history of respiratory congestion. 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 Took Covid test that was negative today. Advised to take another one tomorrow.  Reports no fever or shaking chills chest cough and congestion.  No sore throat.  No shortness of breath.  She had flu vaccine in late October.  She has had 4 COVID vaccines but I do not see a booster this fall.  She has had Prevnar 13 and pneumococcal 23 vaccines.  She has a history of sleep apnea, tachycardia treated by cardiologist, obesity, asthma and allergic rhinitis.  History of hyperlipidemia and impaired glucose tolerance.  History of right breast cancer with lumpectomy performed by Dr. Karie Chimera in 2000.  History of sleep apnea.  Has had work-up for sinus tachycardia and stress test was negative.  2D echocardiogram showed normal ejection fraction with diastolic dysfunction.  Due to the Coronavirus pandemic patient is seen via interactive audio and video telecommunications today.  She is identified using 2 identifiers as Brittany Bender, a patient in this practice.  She is agreeable to visit in this format today.  Patient is at her home and I am at my office.  Review of Systems see above     Objective:   Physical Exam Patient reports her blood pressure today 149/89 and that her temp is 96.1 taken on the temple.  She does not appear to be tachypneic.  She sounds nasally congested and has a cough.  Has malaise and fatigue.  Has not gotten better despite staying home for a week.       Assessment & Plan:  Acute lower respiratory infection-home COVID-19 test is negative.  Recommend she take another test tomorrow.  Plan: Zithromax Z-PAK 2 tabs day 1 followed by 1 tab days 2 through 5.  Hycodan 1 teaspoon every 8 hours as needed for cough. Rest and drink fluids.  Walk some to prevent  atelectasis.  Take another COVID test tomorrow.  Watch she has recovered from this illness, she needs COVID booster.  Monitor pulse oximetry.  Time spent with this visit is 20 minutes

## 2021-01-04 NOTE — Telephone Encounter (Signed)
Brittany Bender called to say her prescriptions went to [facility] and she has never used them. They need to be sent to [facility]

## 2021-01-04 NOTE — Telephone Encounter (Signed)
Sent Rxs for Z-pak and Hycodan to [facility] instead of [facility]. Have just now resent these to correct pharmacy.

## 2021-01-05 ENCOUNTER — Encounter: Payer: Self-pay | Admitting: Internal Medicine

## 2021-02-13 DIAGNOSIS — M25511 Pain in right shoulder: Secondary | ICD-10-CM | POA: Diagnosis not present

## 2021-02-14 ENCOUNTER — Other Ambulatory Visit: Payer: Self-pay | Admitting: Internal Medicine

## 2021-03-01 ENCOUNTER — Other Ambulatory Visit: Payer: Self-pay | Admitting: Internal Medicine

## 2021-03-06 ENCOUNTER — Other Ambulatory Visit: Payer: Self-pay | Admitting: Internal Medicine

## 2021-05-14 ENCOUNTER — Encounter: Payer: Self-pay | Admitting: Internal Medicine

## 2021-05-14 ENCOUNTER — Telehealth: Payer: Medicare PPO | Admitting: Internal Medicine

## 2021-05-14 DIAGNOSIS — R7302 Impaired glucose tolerance (oral): Secondary | ICD-10-CM

## 2021-05-14 DIAGNOSIS — Z6841 Body Mass Index (BMI) 40.0 and over, adult: Secondary | ICD-10-CM | POA: Diagnosis not present

## 2021-05-14 DIAGNOSIS — U071 COVID-19: Secondary | ICD-10-CM | POA: Diagnosis not present

## 2021-05-14 DIAGNOSIS — Z853 Personal history of malignant neoplasm of breast: Secondary | ICD-10-CM

## 2021-05-14 DIAGNOSIS — G4733 Obstructive sleep apnea (adult) (pediatric): Secondary | ICD-10-CM | POA: Diagnosis not present

## 2021-05-14 DIAGNOSIS — J4520 Mild intermittent asthma, uncomplicated: Secondary | ICD-10-CM | POA: Diagnosis not present

## 2021-05-14 MED ORDER — HYDROCODONE BIT-HOMATROP MBR 5-1.5 MG/5ML PO SOLN: 5.0000 mL | Freq: Three times a day (TID) | ORAL | 0 refills | Status: DC | PRN

## 2021-05-14 MED ORDER — AZITHROMYCIN 250 MG PO TABS: ORAL_TABLET | ORAL | 0 refills | Status: AC

## 2021-05-14 MED ORDER — FLUCONAZOLE 150 MG PO TABS: 150.0000 mg | ORAL_TABLET | Freq: Once | ORAL | 0 refills | Status: AC

## 2021-05-14 NOTE — Progress Notes (Signed)
? ?  Subjective:  ? ? Patient ID: Brittany Bender, female    DOB: 1951-01-19, 71 y.o.   MRN: [id number] ? ?HPI 71 year old Female seen today via interactive audio and video telecommunications due to the Coronavirus pandemic.  She is identified using 2 identifiers as Brittany Bender, a patient in this practice.  She is at her home and I am at my office.  She is agreeable to visit in this format today. ? ?Patient and her Female friend travel to [place] by train last week.  Patient subsequently developed sore throat, cough, fever chills and headache on Thursday evening April 13.  Her female friend is also tested positive for COVID-19.  She returned home on Saturday, April 15,and is currently quarantined at home.  He tested positive for COVID-19 but she has not tested as of yet.  She remains symptomatic with cough and respiratory congestion. ? ?Patient has a history of sleep apnea, sinus tachycardia treated by cardiologist, obesity, allergic rhinitis and asthma.  History of impaired glucose tolerance and hyperlipidemia.  History of right breast cancer in 2000. ? ? ? ?Review of Systems see above- ? ?   ?Objective:  ? Physical Exam ?She did not provide vital signs to [place] today.  She looks fatigued and is resting at home.  She is able to give a clear concise history and does not appear to be short of breath.  She sounds nasally congested. ? ? ? ?   ?Assessment & Plan:  ?Acute COVID-19 virus infection ? ?Plan: We discussed treatment options including Paxlovid versus antibiotics and cough medication.  She has been ill since Thursday evening and it is now Monday morning.  I think in view of several days passing since onset of symptoms, it is best to treat her with an antibiotic and cough medication rather than Paxlovid.  It is a little late to be starting Paxlovid at this time.  She is agreeable to this treatment.  Therefore we are prescribing for her Zithromax Z-PAK 2 tabs day 1 followed by 1 tab days 2 through 5, Hycodan 1  teaspoon every 8 hours as needed for cough.  Diflucan 150 mg tablet one-time dose if needed for Candida vaginitis while on antibiotics.  Technically she needs to quarantine through Tuesday since symptoms had onset on Thursday evening April 13 but since she was traveling until Saturday, April 15 and remains fatigued she may want to take a few days off work this week.  She should rest and drink plenty of fluids.  She should walk some to prevent atelectasis and should monitor her pulse oximetry.  She will let me know if not improving within 24 to 48 hours on the treatment prescribed.  She may however feel fatigued for several days. ? ?Time spent with this visit is 20 minutes including chart review, interviewing patient and E scribing medications ? ?

## 2021-05-14 NOTE — Patient Instructions (Addendum)
We discussed treatment options but I feel it is a little late to be starting Paxlovid since she has been ill for a few days now.  I am prescribing Zithromax Z pak to take 2 tabs day 1 followed by 1 tab days 2 through 5.  She will take Hycodan 1 teaspoon every 8 hours as needed for cough.  Was also prescribed Diflucan 150 mg tablet one-time dose if needed for Candida vaginitis while on antibiotics.  Rest and drink plenty of fluids.  Monitor pulse oximetry and walk some to prevent atelectasis of the lungs.  Recommend that she take a few days off work.  Technically has to quarantine through tomorrow which will be 5 days since onset of symptoms. ?

## 2021-05-14 NOTE — Telephone Encounter (Signed)
Appt made for today at 215 video visit.  ?

## 2021-05-15 ENCOUNTER — Other Ambulatory Visit: Payer: Self-pay | Admitting: Internal Medicine

## 2021-05-15 ENCOUNTER — Telehealth: Payer: Medicare PPO | Admitting: Internal Medicine

## 2021-05-30 ENCOUNTER — Other Ambulatory Visit: Payer: Self-pay | Admitting: Internal Medicine

## 2021-06-15 ENCOUNTER — Other Ambulatory Visit: Payer: Medicare PPO

## 2021-06-15 DIAGNOSIS — R7302 Impaired glucose tolerance (oral): Secondary | ICD-10-CM

## 2021-06-15 DIAGNOSIS — E7800 Pure hypercholesterolemia, unspecified: Secondary | ICD-10-CM

## 2021-06-15 DIAGNOSIS — R030 Elevated blood-pressure reading, without diagnosis of hypertension: Secondary | ICD-10-CM | POA: Diagnosis not present

## 2021-06-15 DIAGNOSIS — E559 Vitamin D deficiency, unspecified: Secondary | ICD-10-CM

## 2021-06-15 DIAGNOSIS — I5189 Other ill-defined heart diseases: Secondary | ICD-10-CM | POA: Diagnosis not present

## 2021-06-15 DIAGNOSIS — R5383 Other fatigue: Secondary | ICD-10-CM

## 2021-06-16 LAB — LIPID PANEL
Cholesterol: 187 mg/dL (ref <200)
HDL: 80 mg/dL (ref >=50)
LDL Cholesterol (Calc): 90 mg/dL (calc)
Non-HDL Cholesterol (Calc): 107 mg/dL (calc) (ref <130)
Total CHOL/HDL Ratio: 2.3 (calc) (ref <5.0)
Triglycerides: 84 mg/dL (ref <150)

## 2021-06-16 LAB — CBC WITH DIFFERENTIAL/PLATELET
Absolute Monocytes: 241 cells/uL (ref 200–950)
Basophils Absolute: 10 cells/uL (ref 0–200)
Basophils Relative: 0.3 %
Eosinophils Absolute: 129 cells/uL (ref 15–500)
Eosinophils Relative: 3.8 %
HCT: 36 % (ref 35.0–45.0)
Hemoglobin: 12 g/dL (ref 11.7–15.5)
Lymphs Abs: 1428 cells/uL (ref 850–3900)
MCH: 30.6 pg (ref 27.0–33.0)
MCHC: 33.3 g/dL (ref 32.0–36.0)
MCV: 91.8 fL (ref 80.0–100.0)
MPV: 10.3 fL (ref 7.5–12.5)
Monocytes Relative: 7.1 %
Neutro Abs: 1591 cells/uL (ref 1500–7800)
Neutrophils Relative %: 46.8 %
Platelets: 237 10*3/uL (ref 140–400)
RBC: 3.92 10*6/uL (ref 3.80–5.10)
RDW: 15 % (ref 11.0–15.0)
Total Lymphocyte: 42 %
WBC: 3.4 10*3/uL — ABNORMAL LOW (ref 3.8–10.8)

## 2021-06-16 LAB — COMPLETE METABOLIC PANEL WITH GFR
AG Ratio: 1.2 (calc) (ref 1.0–2.5)
ALT: 7 U/L (ref 6–29)
AST: 16 U/L (ref 10–35)
Albumin: 4 g/dL (ref 3.6–5.1)
Alkaline phosphatase (APISO): 70 U/L (ref 37–153)
BUN: 10 mg/dL (ref 7–25)
CO2: 24 mmol/L (ref 20–32)
Calcium: 8.9 mg/dL (ref 8.6–10.4)
Chloride: 108 mmol/L (ref 98–110)
Creat: 0.68 mg/dL (ref 0.50–1.05)
Globulin: 3.4 g/dL (calc) (ref 1.9–3.7)
Glucose, Bld: 96 mg/dL (ref 65–99)
Potassium: 3.7 mmol/L (ref 3.5–5.3)
Sodium: 143 mmol/L (ref 135–146)
Total Bilirubin: 0.4 mg/dL (ref 0.2–1.2)
Total Protein: 7.4 g/dL (ref 6.1–8.1)
eGFR: 94 mL/min/{1.73_m2} (ref >=60)

## 2021-06-16 LAB — TSH: TSH: 1.97 mIU/L (ref 0.40–4.50)

## 2021-06-16 LAB — VITAMIN D 25 HYDROXY (VIT D DEFICIENCY, FRACTURES): Vit D, 25-Hydroxy: 50 ng/mL (ref 30–100)

## 2021-06-16 LAB — HEMOGLOBIN A1C
Hgb A1c MFr Bld: 5.7 % of total Hgb — ABNORMAL HIGH (ref <5.7)
Mean Plasma Glucose: 117 mg/dL
eAG (mmol/L): 6.5 mmol/L

## 2021-06-18 ENCOUNTER — Encounter: Payer: Self-pay | Admitting: Internal Medicine

## 2021-06-18 ENCOUNTER — Ambulatory Visit (INDEPENDENT_AMBULATORY_CARE_PROVIDER_SITE_OTHER): Payer: Medicare PPO | Admitting: Internal Medicine

## 2021-06-18 VITALS — BP 138/82 | HR 64 | Temp 97.9°F | Ht 64.75 in | Wt 272.5 lb

## 2021-06-18 DIAGNOSIS — Z0000 Encounter for general adult medical examination without abnormal findings: Secondary | ICD-10-CM

## 2021-06-18 DIAGNOSIS — J4520 Mild intermittent asthma, uncomplicated: Secondary | ICD-10-CM | POA: Diagnosis not present

## 2021-06-18 DIAGNOSIS — R7302 Impaired glucose tolerance (oral): Secondary | ICD-10-CM | POA: Diagnosis not present

## 2021-06-18 DIAGNOSIS — Z8659 Personal history of other mental and behavioral disorders: Secondary | ICD-10-CM

## 2021-06-18 DIAGNOSIS — Z6841 Body Mass Index (BMI) 40.0 and over, adult: Secondary | ICD-10-CM | POA: Diagnosis not present

## 2021-06-18 DIAGNOSIS — I5189 Other ill-defined heart diseases: Secondary | ICD-10-CM | POA: Diagnosis not present

## 2021-06-18 DIAGNOSIS — E7800 Pure hypercholesterolemia, unspecified: Secondary | ICD-10-CM | POA: Diagnosis not present

## 2021-06-18 DIAGNOSIS — G4733 Obstructive sleep apnea (adult) (pediatric): Secondary | ICD-10-CM

## 2021-06-18 DIAGNOSIS — J301 Allergic rhinitis due to pollen: Secondary | ICD-10-CM

## 2021-06-18 DIAGNOSIS — J302 Other seasonal allergic rhinitis: Secondary | ICD-10-CM

## 2021-06-18 DIAGNOSIS — K5909 Other constipation: Secondary | ICD-10-CM | POA: Diagnosis not present

## 2021-06-18 DIAGNOSIS — Z9889 Other specified postprocedural states: Secondary | ICD-10-CM

## 2021-06-18 DIAGNOSIS — Z853 Personal history of malignant neoplasm of breast: Secondary | ICD-10-CM | POA: Diagnosis not present

## 2021-06-18 LAB — POCT URINALYSIS DIPSTICK
Bilirubin, UA: NEGATIVE
Blood, UA: NEGATIVE
Glucose, UA: NEGATIVE
Ketones, UA: NEGATIVE
Leukocytes, UA: NEGATIVE
Nitrite, UA: NEGATIVE
Protein, UA: NEGATIVE
Spec Grav, UA: 1.01 (ref 1.010–1.025)
Urobilinogen, UA: 0.2 E.U./dL
pH, UA: 6 (ref 5.0–8.0)

## 2021-06-18 MED ORDER — ALPRAZOLAM 0.5 MG PO TABS: 0.5000 mg | ORAL_TABLET | Freq: Every evening | ORAL | 1 refills | Status: DC | PRN

## 2021-06-18 MED ORDER — ALBUTEROL SULFATE HFA 108 (90 BASE) MCG/ACT IN AERS: 2.0000 | INHALATION_SPRAY | Freq: Four times a day (QID) | RESPIRATORY_TRACT | 2 refills | Status: DC | PRN
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
## 2021-07-02 ENCOUNTER — Other Ambulatory Visit: Payer: Self-pay | Admitting: Obstetrics & Gynecology

## 2021-07-02 DIAGNOSIS — Z1231 Encounter for screening mammogram for malignant neoplasm of breast: Secondary | ICD-10-CM

## 2021-07-23 ENCOUNTER — Encounter: Payer: Self-pay | Admitting: Internal Medicine

## 2021-07-23 ENCOUNTER — Ambulatory Visit: Payer: Medicare PPO | Admitting: Internal Medicine

## 2021-07-23 VITALS — BP 122/76 | HR 87 | Temp 98.4°F | Ht 64.75 in | Wt 271.8 lb

## 2021-07-23 DIAGNOSIS — R7302 Impaired glucose tolerance (oral): Secondary | ICD-10-CM

## 2021-07-23 DIAGNOSIS — Z6841 Body Mass Index (BMI) 40.0 and over, adult: Secondary | ICD-10-CM | POA: Diagnosis not present

## 2021-07-23 NOTE — Progress Notes (Signed)
   Subjective:    Patient ID: Brittany Bender, female    DOB: 12-19-1950, 71 y.o.   MRN: [id number]  HPI 71 year old Female for follow up on weight loss efforts.  At last visit, was given samples of  low dose Rybelsus. Had no side effects. Not motivated to do accuchecks. Had no symptoms of hypoglycemia.Has been to [facility] and will be joining gym there.   She is interested in [place] or Ozempic. Told her rybelsus would soon be approved for weight loss as well.   She has mild glucose intolerance with recent Hgb AIC 5.7%  Her BMI is 45.57.  I am suggesting she contact [facility] or [facility]. Suggested she keep accucheck readings which would be helpful for insurance approval on these medications she is requesting. I am glad she has committed to [employer].    Review of Systems see above     Objective:   Physical Exam  Blood pressure 122/76 pulse 87 temperature 98.4 degrees pulse oximetry 97% weight 271 pounds 12 ounces BMI 45.57      Assessment & Plan:   Glucose intolerance with recent Hemoglobin A1c  of 5.7%  BMI 45.57-has joined [employer].   Patient tried low-dose Rybelsus samples for one month and only lost 1 pound from last visit on May 22.  I do not believe I will be able to get Mounjaro or Ozempic approved for her by her insurance company.

## 2021-07-25 DIAGNOSIS — R7303 Prediabetes: Secondary | ICD-10-CM | POA: Diagnosis not present

## 2021-07-25 DIAGNOSIS — M549 Dorsalgia, unspecified: Secondary | ICD-10-CM | POA: Diagnosis not present

## 2021-07-25 DIAGNOSIS — R948 Abnormal results of function studies of other organs and systems: Secondary | ICD-10-CM | POA: Diagnosis not present

## 2021-07-25 DIAGNOSIS — R5383 Other fatigue: Secondary | ICD-10-CM | POA: Diagnosis not present

## 2021-07-25 DIAGNOSIS — R000 Tachycardia, unspecified: Secondary | ICD-10-CM | POA: Diagnosis not present

## 2021-07-25 DIAGNOSIS — J45909 Unspecified asthma, uncomplicated: Secondary | ICD-10-CM | POA: Diagnosis not present

## 2021-07-25 DIAGNOSIS — E785 Hyperlipidemia, unspecified: Secondary | ICD-10-CM | POA: Diagnosis not present

## 2021-07-25 DIAGNOSIS — E559 Vitamin D deficiency, unspecified: Secondary | ICD-10-CM | POA: Diagnosis not present

## 2021-07-25 DIAGNOSIS — G4733 Obstructive sleep apnea (adult) (pediatric): Secondary | ICD-10-CM | POA: Diagnosis not present

## 2021-08-08 DIAGNOSIS — Z853 Personal history of malignant neoplasm of breast: Secondary | ICD-10-CM | POA: Diagnosis not present

## 2021-08-08 DIAGNOSIS — M549 Dorsalgia, unspecified: Secondary | ICD-10-CM | POA: Diagnosis not present

## 2021-08-08 DIAGNOSIS — J45909 Unspecified asthma, uncomplicated: Secondary | ICD-10-CM | POA: Diagnosis not present

## 2021-08-08 DIAGNOSIS — G4733 Obstructive sleep apnea (adult) (pediatric): Secondary | ICD-10-CM | POA: Diagnosis not present

## 2021-08-08 DIAGNOSIS — R000 Tachycardia, unspecified: Secondary | ICD-10-CM | POA: Diagnosis not present

## 2021-08-08 DIAGNOSIS — R948 Abnormal results of function studies of other organs and systems: Secondary | ICD-10-CM | POA: Diagnosis not present

## 2021-08-08 DIAGNOSIS — E559 Vitamin D deficiency, unspecified: Secondary | ICD-10-CM | POA: Diagnosis not present

## 2021-08-08 DIAGNOSIS — E785 Hyperlipidemia, unspecified: Secondary | ICD-10-CM | POA: Diagnosis not present

## 2021-08-08 DIAGNOSIS — R7303 Prediabetes: Secondary | ICD-10-CM | POA: Diagnosis not present

## 2021-08-18 NOTE — Patient Instructions (Addendum)
She will try samples of Rybelsus for impaired glucose tolerance and weight loss.  Follow-up in 4 weeks.  Otherwise continue current medications as previously prescribed.  Refilled albuterol inhaler and Xanax at her request.

## 2021-08-22 DIAGNOSIS — R000 Tachycardia, unspecified: Secondary | ICD-10-CM | POA: Diagnosis not present

## 2021-08-22 DIAGNOSIS — K5900 Constipation, unspecified: Secondary | ICD-10-CM | POA: Diagnosis not present

## 2021-08-22 DIAGNOSIS — E559 Vitamin D deficiency, unspecified: Secondary | ICD-10-CM | POA: Diagnosis not present

## 2021-08-22 DIAGNOSIS — M549 Dorsalgia, unspecified: Secondary | ICD-10-CM | POA: Diagnosis not present

## 2021-08-22 DIAGNOSIS — R7303 Prediabetes: Secondary | ICD-10-CM | POA: Diagnosis not present

## 2021-08-22 DIAGNOSIS — R948 Abnormal results of function studies of other organs and systems: Secondary | ICD-10-CM | POA: Diagnosis not present

## 2021-08-22 DIAGNOSIS — E785 Hyperlipidemia, unspecified: Secondary | ICD-10-CM | POA: Diagnosis not present

## 2021-08-22 DIAGNOSIS — J45909 Unspecified asthma, uncomplicated: Secondary | ICD-10-CM | POA: Diagnosis not present

## 2021-08-22 DIAGNOSIS — G4733 Obstructive sleep apnea (adult) (pediatric): Secondary | ICD-10-CM | POA: Diagnosis not present

## 2021-08-26 ENCOUNTER — Other Ambulatory Visit: Payer: Self-pay | Admitting: Internal Medicine

## 2021-08-29 ENCOUNTER — Other Ambulatory Visit: Payer: Self-pay | Admitting: Internal Medicine

## 2021-09-06 DIAGNOSIS — N951 Menopausal and female climacteric states: Secondary | ICD-10-CM | POA: Diagnosis not present

## 2021-09-06 DIAGNOSIS — N905 Atrophy of vulva: Secondary | ICD-10-CM | POA: Diagnosis not present

## 2021-09-06 DIAGNOSIS — Z853 Personal history of malignant neoplasm of breast: Secondary | ICD-10-CM | POA: Diagnosis not present

## 2021-09-06 DIAGNOSIS — Z01419 Encounter for gynecological examination (general) (routine) without abnormal findings: Secondary | ICD-10-CM | POA: Diagnosis not present

## 2021-09-06 DIAGNOSIS — Z6841 Body Mass Index (BMI) 40.0 and over, adult: Secondary | ICD-10-CM | POA: Diagnosis not present

## 2021-09-12 DIAGNOSIS — R948 Abnormal results of function studies of other organs and systems: Secondary | ICD-10-CM | POA: Diagnosis not present

## 2021-09-12 DIAGNOSIS — J45909 Unspecified asthma, uncomplicated: Secondary | ICD-10-CM | POA: Diagnosis not present

## 2021-09-12 DIAGNOSIS — G4733 Obstructive sleep apnea (adult) (pediatric): Secondary | ICD-10-CM | POA: Diagnosis not present

## 2021-09-12 DIAGNOSIS — R000 Tachycardia, unspecified: Secondary | ICD-10-CM | POA: Diagnosis not present

## 2021-09-12 DIAGNOSIS — R7303 Prediabetes: Secondary | ICD-10-CM | POA: Diagnosis not present

## 2021-09-12 DIAGNOSIS — K5900 Constipation, unspecified: Secondary | ICD-10-CM | POA: Diagnosis not present

## 2021-09-12 DIAGNOSIS — M549 Dorsalgia, unspecified: Secondary | ICD-10-CM | POA: Diagnosis not present

## 2021-09-12 DIAGNOSIS — E559 Vitamin D deficiency, unspecified: Secondary | ICD-10-CM | POA: Diagnosis not present

## 2021-09-12 DIAGNOSIS — E785 Hyperlipidemia, unspecified: Secondary | ICD-10-CM | POA: Diagnosis not present

## 2021-10-03 ENCOUNTER — Other Ambulatory Visit: Payer: Self-pay | Admitting: Internal Medicine

## 2021-10-09 DIAGNOSIS — K5903 Drug induced constipation: Secondary | ICD-10-CM | POA: Diagnosis not present

## 2021-10-09 DIAGNOSIS — R948 Abnormal results of function studies of other organs and systems: Secondary | ICD-10-CM | POA: Diagnosis not present

## 2021-10-09 DIAGNOSIS — R7303 Prediabetes: Secondary | ICD-10-CM | POA: Diagnosis not present

## 2021-10-30 DIAGNOSIS — K5900 Constipation, unspecified: Secondary | ICD-10-CM | POA: Diagnosis not present

## 2021-10-30 DIAGNOSIS — R7303 Prediabetes: Secondary | ICD-10-CM | POA: Diagnosis not present

## 2021-10-30 DIAGNOSIS — E785 Hyperlipidemia, unspecified: Secondary | ICD-10-CM | POA: Diagnosis not present

## 2021-10-30 DIAGNOSIS — R000 Tachycardia, unspecified: Secondary | ICD-10-CM | POA: Diagnosis not present

## 2021-10-30 DIAGNOSIS — R948 Abnormal results of function studies of other organs and systems: Secondary | ICD-10-CM | POA: Diagnosis not present

## 2021-11-04 ENCOUNTER — Encounter: Payer: Self-pay | Admitting: Internal Medicine

## 2021-11-19 ENCOUNTER — Ambulatory Visit
Admission: RE | Admit: 2021-11-19 | Discharge: 2021-11-19 | Disposition: A | Discharge status: Home / Self Care | Payer: Medicare PPO | Source: Ambulatory Visit | Attending: Obstetrics & Gynecology | Admitting: Obstetrics & Gynecology

## 2021-11-19 DIAGNOSIS — Z1231 Encounter for screening mammogram for malignant neoplasm of breast: Secondary | ICD-10-CM | POA: Diagnosis not present

## 2021-11-19 LAB — HM MAMMOGRAPHY

## 2021-11-20 DIAGNOSIS — R7303 Prediabetes: Secondary | ICD-10-CM | POA: Diagnosis not present

## 2021-11-20 DIAGNOSIS — K5900 Constipation, unspecified: Secondary | ICD-10-CM | POA: Diagnosis not present

## 2021-11-20 DIAGNOSIS — R948 Abnormal results of function studies of other organs and systems: Secondary | ICD-10-CM | POA: Diagnosis not present

## 2021-11-21 ENCOUNTER — Encounter: Payer: Self-pay | Admitting: Internal Medicine

## 2021-11-21 ENCOUNTER — Other Ambulatory Visit: Payer: Self-pay | Admitting: Obstetrics & Gynecology

## 2021-11-21 DIAGNOSIS — R928 Other abnormal and inconclusive findings on diagnostic imaging of breast: Secondary | ICD-10-CM

## 2021-11-30 ENCOUNTER — Other Ambulatory Visit: Payer: Self-pay | Admitting: Obstetrics & Gynecology

## 2021-11-30 ENCOUNTER — Ambulatory Visit
Admission: RE | Admit: 2021-11-30 | Discharge: 2021-11-30 | Disposition: A | Discharge status: Home / Self Care | Payer: Medicare PPO | Source: Ambulatory Visit | Attending: Obstetrics & Gynecology | Admitting: Obstetrics & Gynecology

## 2021-11-30 DIAGNOSIS — R928 Other abnormal and inconclusive findings on diagnostic imaging of breast: Secondary | ICD-10-CM

## 2021-11-30 DIAGNOSIS — R921 Mammographic calcification found on diagnostic imaging of breast: Secondary | ICD-10-CM | POA: Diagnosis not present

## 2021-11-30 LAB — HM MAMMOGRAPHY

## 2021-12-07 ENCOUNTER — Encounter: Payer: Self-pay | Admitting: Internal Medicine

## 2021-12-07 ENCOUNTER — Other Ambulatory Visit: Payer: Medicare PPO

## 2021-12-07 DIAGNOSIS — R7302 Impaired glucose tolerance (oral): Secondary | ICD-10-CM | POA: Diagnosis not present

## 2021-12-07 DIAGNOSIS — E7800 Pure hypercholesterolemia, unspecified: Secondary | ICD-10-CM

## 2021-12-08 LAB — LIPID PANEL
Cholesterol: 156 mg/dL (ref <200)
HDL: 65 mg/dL (ref >=50)
LDL Cholesterol (Calc): 75 mg/dL (calc)
Non-HDL Cholesterol (Calc): 91 mg/dL (calc) (ref <130)
Total CHOL/HDL Ratio: 2.4 (calc) (ref <5.0)
Triglycerides: 76 mg/dL (ref <150)

## 2021-12-08 LAB — HEPATIC FUNCTION PANEL
AG Ratio: 1.2 (calc) (ref 1.0–2.5)
ALT: 8 U/L (ref 6–29)
AST: 15 U/L (ref 10–35)
Albumin: 4 g/dL (ref 3.6–5.1)
Alkaline phosphatase (APISO): 68 U/L (ref 37–153)
Bilirubin, Direct: 0.1 mg/dL (ref 0.0–0.2)
Globulin: 3.4 g/dL (calc) (ref 1.9–3.7)
Indirect Bilirubin: 0.3 mg/dL (calc) (ref 0.2–1.2)
Total Bilirubin: 0.4 mg/dL (ref 0.2–1.2)
Total Protein: 7.4 g/dL (ref 6.1–8.1)

## 2021-12-08 LAB — HEMOGLOBIN A1C
Hgb A1c MFr Bld: 5.9 % of total Hgb — ABNORMAL HIGH (ref <5.7)
Mean Plasma Glucose: 123 mg/dL
eAG (mmol/L): 6.8 mmol/L

## 2021-12-11 ENCOUNTER — Encounter: Payer: Self-pay | Admitting: Internal Medicine

## 2021-12-11 ENCOUNTER — Ambulatory Visit: Payer: Medicare PPO | Admitting: Internal Medicine

## 2021-12-11 VITALS — BP 120/78 | HR 88 | Temp 98.4°F | Ht 64.75 in | Wt 262.1 lb

## 2021-12-11 DIAGNOSIS — F411 Generalized anxiety disorder: Secondary | ICD-10-CM | POA: Diagnosis not present

## 2021-12-11 DIAGNOSIS — E7800 Pure hypercholesterolemia, unspecified: Secondary | ICD-10-CM | POA: Diagnosis not present

## 2021-12-11 DIAGNOSIS — M7918 Myalgia, other site: Secondary | ICD-10-CM | POA: Diagnosis not present

## 2021-12-11 DIAGNOSIS — R7302 Impaired glucose tolerance (oral): Secondary | ICD-10-CM | POA: Diagnosis not present

## 2021-12-11 DIAGNOSIS — Z6841 Body Mass Index (BMI) 40.0 and over, adult: Secondary | ICD-10-CM

## 2021-12-11 DIAGNOSIS — Z853 Personal history of malignant neoplasm of breast: Secondary | ICD-10-CM

## 2021-12-11 DIAGNOSIS — R928 Other abnormal and inconclusive findings on diagnostic imaging of breast: Secondary | ICD-10-CM

## 2021-12-11 NOTE — Progress Notes (Signed)
   Subjective:    Patient ID: Brittany Bender, female    DOB: 1951/01/10, 71 y.o.   MRN: [id number]  HPI She is here for 38-monthfollow-up appointment.  Had Medicare wellness visit in May 2023.  Saw Dr. WStann Mainlandfor GYN exam in August.  Her hemoglobin A1c is stable at 5.9%.  It was 5.7% in May.  Is scheduled to have needle biopsy left breast upper inner quadrant due to abnormal mammogram.  This is to be done on November 16.  Both lipid panel and liver functions are within normal limits on statin medication.  She has a history of hypertension treated with metoprolol.  History of anxiety treated with Xanax.  History of vitamin D deficiency treated with high-dose vitamin D weekly.  For musculoskeletal pain,has taken Celebrex.      Review of Systems no new complaints     Objective:   Physical Exam  Vital signs reviewed.  Blood pressure is stable.  Weight is 262 pounds 1.9 ounces BMI 43.96  No thyromegaly.  Chest clear.  Cardiac exam: Regular rate and rhythm.  No lower extremity pitting edema.  Affect is normal.    Assessment & Plan:  History of anxiety treated with Xanax as needed  History of vitamin D deficiency treated with high-dose vitamin D weekly  Abnormal mammogram scheduled for needle biopsy left breast upper inner quadrant on November 16.  Hyperlipidemia with normal lipid panel on statin medication  Essential hypertension stable on current regimen with metoprolol  Musculoskeletal pain treated with Celebrex.  Plan: Continue with current regimen.

## 2021-12-12 DIAGNOSIS — R7303 Prediabetes: Secondary | ICD-10-CM | POA: Diagnosis not present

## 2021-12-12 DIAGNOSIS — Z6841 Body Mass Index (BMI) 40.0 and over, adult: Secondary | ICD-10-CM | POA: Diagnosis not present

## 2021-12-12 DIAGNOSIS — Z853 Personal history of malignant neoplasm of breast: Secondary | ICD-10-CM | POA: Diagnosis not present

## 2021-12-12 DIAGNOSIS — K5900 Constipation, unspecified: Secondary | ICD-10-CM | POA: Diagnosis not present

## 2021-12-12 DIAGNOSIS — R948 Abnormal results of function studies of other organs and systems: Secondary | ICD-10-CM | POA: Diagnosis not present

## 2021-12-13 ENCOUNTER — Ambulatory Visit
Admission: RE | Admit: 2021-12-13 | Discharge: 2021-12-13 | Disposition: A | Discharge status: Home / Self Care | Payer: Medicare PPO | Source: Ambulatory Visit | Attending: Obstetrics & Gynecology | Admitting: Obstetrics & Gynecology

## 2021-12-13 DIAGNOSIS — R928 Other abnormal and inconclusive findings on diagnostic imaging of breast: Secondary | ICD-10-CM

## 2021-12-13 DIAGNOSIS — R921 Mammographic calcification found on diagnostic imaging of breast: Secondary | ICD-10-CM | POA: Diagnosis not present

## 2021-12-13 DIAGNOSIS — N6489 Other specified disorders of breast: Secondary | ICD-10-CM | POA: Diagnosis not present

## 2021-12-13 HISTORY — PX: BREAST BIOPSY: SHX20

## 2021-12-13 LAB — HM MAMMOGRAPHY

## 2022-01-02 DIAGNOSIS — Z853 Personal history of malignant neoplasm of breast: Secondary | ICD-10-CM | POA: Diagnosis not present

## 2022-01-02 DIAGNOSIS — R7303 Prediabetes: Secondary | ICD-10-CM | POA: Diagnosis not present

## 2022-01-02 DIAGNOSIS — Z6841 Body Mass Index (BMI) 40.0 and over, adult: Secondary | ICD-10-CM | POA: Diagnosis not present

## 2022-01-02 DIAGNOSIS — R948 Abnormal results of function studies of other organs and systems: Secondary | ICD-10-CM | POA: Diagnosis not present

## 2022-01-02 DIAGNOSIS — K5900 Constipation, unspecified: Secondary | ICD-10-CM | POA: Diagnosis not present

## 2022-01-08 ENCOUNTER — Encounter: Payer: Self-pay | Admitting: Internal Medicine

## 2022-01-22 NOTE — Patient Instructions (Signed)
Labs are stable on current medications.  Return in 6 months for health maintenance exam.

## 2022-01-30 ENCOUNTER — Other Ambulatory Visit: Payer: Self-pay | Admitting: Internal Medicine

## 2022-02-25 DIAGNOSIS — Z6841 Body Mass Index (BMI) 40.0 and over, adult: Secondary | ICD-10-CM | POA: Diagnosis not present

## 2022-02-25 DIAGNOSIS — R948 Abnormal results of function studies of other organs and systems: Secondary | ICD-10-CM | POA: Diagnosis not present

## 2022-02-25 DIAGNOSIS — K5900 Constipation, unspecified: Secondary | ICD-10-CM | POA: Diagnosis not present

## 2022-02-25 DIAGNOSIS — R7303 Prediabetes: Secondary | ICD-10-CM | POA: Diagnosis not present

## 2022-02-26 ENCOUNTER — Other Ambulatory Visit: Payer: Self-pay | Admitting: Internal Medicine

## 2022-03-18 DIAGNOSIS — Z6841 Body Mass Index (BMI) 40.0 and over, adult: Secondary | ICD-10-CM | POA: Diagnosis not present

## 2022-03-18 DIAGNOSIS — R7303 Prediabetes: Secondary | ICD-10-CM | POA: Diagnosis not present

## 2022-03-25 ENCOUNTER — Encounter: Payer: Self-pay | Admitting: Internal Medicine

## 2022-03-25 ENCOUNTER — Telehealth: Payer: Medicare PPO | Admitting: Family Medicine

## 2022-03-25 ENCOUNTER — Telehealth: Payer: Medicare PPO | Admitting: Internal Medicine

## 2022-03-25 VITALS — Ht 64.75 in | Wt 246.0 lb

## 2022-03-25 DIAGNOSIS — R7302 Impaired glucose tolerance (oral): Secondary | ICD-10-CM

## 2022-03-25 DIAGNOSIS — E7800 Pure hypercholesterolemia, unspecified: Secondary | ICD-10-CM

## 2022-03-25 DIAGNOSIS — B9789 Other viral agents as the cause of diseases classified elsewhere: Secondary | ICD-10-CM | POA: Diagnosis not present

## 2022-03-25 DIAGNOSIS — J028 Acute pharyngitis due to other specified organisms: Secondary | ICD-10-CM

## 2022-03-25 DIAGNOSIS — U071 COVID-19: Secondary | ICD-10-CM

## 2022-03-25 DIAGNOSIS — Z853 Personal history of malignant neoplasm of breast: Secondary | ICD-10-CM | POA: Diagnosis not present

## 2022-03-25 DIAGNOSIS — M542 Cervicalgia: Secondary | ICD-10-CM | POA: Diagnosis not present

## 2022-03-25 MED ORDER — NIRMATRELVIR/RITONAVIR (PAXLOVID)TABLET: 3.0000 | ORAL_TABLET | Freq: Two times a day (BID) | ORAL | 0 refills | Status: AC

## 2022-03-25 NOTE — Progress Notes (Signed)
Virtual Visit Consent   Brittany Bender, you are scheduled for a virtual visit with a [facility] provider today. Just as with appointments in the office, your consent must be obtained to participate. Your consent will be active for this visit and any virtual visit you may have with one of our providers in the next 365 days. If you have a MyChart account, a copy of this consent can be sent to you electronically.  As this is a virtual visit, video technology does not allow for your provider to perform a traditional examination. This may limit your provider's ability to fully assess your condition. If your provider identifies any concerns that need to be evaluated in person or the need to arrange testing (such as labs, EKG, etc.), we will make arrangements to do so. Although advances in technology are sophisticated, we cannot ensure that it will always work on either your end or our end. If the connection with a video visit is poor, the visit may have to be switched to a telephone visit. With either a video or telephone visit, we are not always able to ensure that we have a secure connection.  By engaging in this virtual visit, you consent to the provision of healthcare and authorize for your insurance to be billed (if applicable) for the services provided during this visit. Depending on your insurance coverage, you may receive a charge related to this service.  I need to obtain your verbal consent now. Are you willing to proceed with your visit today? Brittany Bender has provided verbal consent on 03/25/2022 for a virtual visit (video or telephone). Perlie Mayo, NP  Date: 03/25/2022 10:00 AM  Virtual Visit via Video Note   I, Perlie Mayo, connected with  Brittany Bender  ([id number], 02/16/1950) on 03/25/22 at 10:00 AM EST by a video-enabled telemedicine application and verified that I am speaking with the correct person using two identifiers.  Location: Patient: Virtual Visit Location Patient:  Home Provider: Virtual Visit Location Provider: Home Office   I discussed the limitations of evaluation and management by telemedicine and the availability of in person appointments. The patient expressed understanding and agreed to proceed.    History of Present Illness: Brittany Bender is a 72 y.o. who identifies as a female who was assigned female at birth, and is being seen today for neck pain since Saturday On the back and both sides. Sore throat as well- hurts to swallow as well- one covid test was + (out of date) and one was neg- in date.  Hot compresses, hot tea, and tylenol-no relief Unsure if both are related or different issues. 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 12/09/2016   Right foot pain 12/09/2016   Cervical spondylosis [id number]   Diastolic dysfunction 123456   Heart rate fast 10/19/2013   OSA (obstructive sleep apnea) 08/02/2013   History of breast cancer 10/22/2012   Elevated LDL cholesterol level 10/22/2012   History of asthma 10/22/2012   Vitamin D deficiency 10/22/2012   Left shoulder pain 10/22/2012   Obesity, unspecified 10/22/2012    Allergies: No Known Allergies Medications:  Current Outpatient Medications:    albuterol (VENTOLIN HFA) 108 (90 Base) MCG/ACT  inhaler, Inhale 2 puffs into the lungs every 6 (six) hours as needed for wheezing or shortness of breath., Disp: 8 g, Rfl: 2   ALPRAZolam (XANAX) 0.5 MG tablet, Take 1 tablet (0.5 mg total) by mouth at bedtime as needed., Disp: 90 tablet, Rfl: 1   celecoxib (CELEBREX) 200 MG capsule, TAKE ONE CAPSULE BY MOUTH ONE TIME DAILY, Disp: 90 capsule, Rfl: 3   folic acid (FOLVITE) 1 MG tablet, TAKE ONE TABLET BY MOUTH ONE TIME DAILY, Disp: 90  tablet, Rfl: 3   levocetirizine (XYZAL) 5 MG tablet, take 1 tablet by mouth daily as needed for allergies, Disp: 90 tablet, Rfl: 3   LINZESS 290 MCG CAPS capsule, Take 1 capsule by mouth once a day before breakfast, Disp: 90 capsule, Rfl: 0   methocarbamol (ROBAXIN) 500 MG tablet, Take 500 mg by mouth every 8 (eight) hours as needed for muscle spasms., Disp: , Rfl:    metoprolol tartrate (LOPRESSOR) 25 MG tablet, TAKE ONE TABLET BY MOUTH TWICE DAILY, Disp: 180 tablet, Rfl: 0   montelukast (SINGULAIR) 10 MG tablet, take 1 tablet by mouth daily as needed, Disp: 90 tablet, Rfl: 3   Multiple Vitamins-Minerals (MULTIVITAMINS THER. W/MINERALS) TABS, Take 2 tablets by mouth daily., Disp: , Rfl:    OVER THE COUNTER MEDICATION, Take 1 tablet by mouth as needed. Quick digest, Disp: , Rfl:    rosuvastatin (CRESTOR) 5 MG tablet, TAKE ONE TABLET BY MOUTH THREE TIMES A WEEK, Disp: 36 tablet, Rfl: 0   Semaglutide,0.25 or 0.'5MG'$ /DOS, (OZEMPIC, 0.25 OR 0.5 MG/DOSE,) 2 MG/3ML SOPN, Inject 1 Units into the skin once a week., Disp: , Rfl:    Vitamin D, Ergocalciferol, (DRISDOL) 1.25 MG (50000 UNIT) CAPS capsule, TAKE ONE CAPSULE BY MOUTH WEEKLY, Disp: 12 capsule, Rfl: 3  Observations/Objective: Patient is well-developed, well-nourished in no acute distress.  Resting comfortably  at home.  Head is normocephalic, atraumatic.  No labored breathing.  Speech is clear and coherent with logical content.  Patient is alert and oriented at baseline.  ROM intact  Assessment and Plan:  1. Neck pain, acute  -continue tylenol as needed  -restart celebrex daily -use on hand flexeril  -continue heating pa -is not improving in 1-2 days follow up in person  2. Sore throat (viral)  -continue warm fluids -salt water gargles -if worsens or develop congestion, fevers and chills- retest for covid   Reviewed side effects, risks and benefits of medication.    Patient acknowledged agreement and understanding of the plan.    Past Medical, Surgical, Social History, Allergies, and Medications have been Reviewed.      Follow Up Instructions: I discussed the assessment and treatment plan with the patient. The patient was provided an opportunity to ask questions and all were answered. The patient agreed with the plan and demonstrated an understanding of the instructions.  A copy of instructions were sent to the patient via MyChart unless otherwise noted below.   The patient was advised to call back or seek an in-person evaluation if the symptoms worsen or if the condition fails to improve as anticipated.  Time:  I spent 10 minutes with the patient via telehealth technology discussing the above problems/concerns.    Perlie Mayo, NP

## 2022-03-25 NOTE — Progress Notes (Addendum)
I called and spoke with Brittany Bender, she thought the video visit was going to be with you. She would like to take the Paxlovid, she is going to do another COVID test, she has one at home that is in date and will call me back with results. She sounds hoarse over the phone, states her throat is very sore, and neck has been hurting since Saturday.  [facility] called back to say 3rd COVID was in date and it was Positive  [facility] [place]

## 2022-03-25 NOTE — Progress Notes (Signed)
Patient Care Team: Elby Showers, MD as PCP - General (Internal Medicine)  I connected with Brittany Bender on 03/25/22 at 3:59 PM by video enabled telemedicine visit and verified that I am speaking with the correct person using two identifiers.   I discussed the limitations, risks, security and privacy concerns of performing an evaluation and management service by telemedicine and the availability of in-person appointments. I also discussed with the patient that there may be a patient responsible charge related to this service. The patient expressed understanding and agreed to proceed.   Other persons participating in the visit and their role in the encounter: Medical scribe, Daiva Huge  Patient's location: Home  Provider's location: Clinic   I provided 10 minutes of face-to-face video visit time during this encounter, and > 50% was spent counseling as documented under my assessment & plan.   Chief Complaint: sore throat   Subjective:    Patient ID: Brittany Bender , Female    DOB: April 11, 1950, 72 y.o.    MRN: [id number]   72 y.o. Female presents today for stiff neck and sore throat. Patient has a past medical history of anemia, anxiety, arthritis, asthma, cancer, GERD, hypertension, sleep apnea.  Reports experiencing a stiff neck, sore throat, runny nose since 03/23/22. Denies fever, cough, diarrhea, vomiting, chills, headache. Taking Tylenol. Reports positive Covid-19 test today.   Past Medical History:  Diagnosis Date   Allergy    Anemia    Anxiety    xanax used primarily for sleep    Arthritis    all over, cervical spine- stenosis    Asthma    last SOB was back in the summer, Never had "attack"   Cancer [facility])    right breast cancer 2000-had radiation   GERD (gastroesophageal reflux disease)    Hypertension    prompted by having palpitations to see Dr,. Linard Millers, he only needs to follow up with her on PRN basis   Personal history of radiation therapy 2000   PONV  (postoperative nausea and vomiting)    Sleep apnea    has OSA- uses CPAP but not for last 2 weeks, due to sinus issues      Family History  Problem Relation Age of Onset   Asthma Father    Heart disease Sister        heart attack x 2   Heart disease Brother        bypass   Breast cancer Maternal Grandmother    Colon cancer Neg Hx    Colon polyps Neg Hx    Esophageal cancer Neg Hx    Rectal cancer Neg Hx    Stomach cancer Neg Hx     Social History   Social History Narrative   Not on file      Review of Systems  Constitutional:  Negative for chills, fever and malaise/fatigue.  HENT:  Positive for sore throat. Negative for congestion.        (+) Runny nose  Eyes:  Negative for blurred vision.  Respiratory:  Negative for cough and shortness of breath.   Cardiovascular:  Negative for chest pain, palpitations and leg swelling.  Gastrointestinal:  Negative for diarrhea, nausea and vomiting.  Musculoskeletal:  Positive for neck pain. Negative for back pain.  Skin:  Negative for rash.  Neurological:  Negative for loss of consciousness and headaches.        Objective:   Vitals: Ht 5' 4.75" (1.645 m)   Wt  246 lb (111.6 kg)   BMI 41.25 kg/m    Physical Exam Vitals and nursing note reviewed.  Constitutional:      General: She is not in acute distress.    Appearance: Normal appearance. She is not toxic-appearing.  HENT:     Head: Normocephalic and atraumatic.  Pulmonary:     Effort: Pulmonary effort is normal.  Skin:    General: Skin is warm and dry.  Neurological:     Mental Status: She is alert and oriented to person, place, and time. Mental status is at baseline.  Psychiatric:        Mood and Affect: Mood normal.        Behavior: Behavior normal.        Thought Content: Thought content normal.        Judgment: Judgment normal.       Results:   Studies obtained and personally reviewed by me:   Labs:       Component Value Date/Time   NA 143  06/15/2021 0921   K 3.7 06/15/2021 0921   CL 108 06/15/2021 0921   CO2 24 06/15/2021 0921   GLUCOSE 96 06/15/2021 0921   BUN 10 06/15/2021 0921   CREATININE 0.68 06/15/2021 0921   CALCIUM 8.9 06/15/2021 0921   PROT 7.4 12/07/2021 0922   ALBUMIN 3.5 09/21/2019 0941   AST 15 12/07/2021 0922   AST 11 (L) 09/21/2019 0941   ALT 8 12/07/2021 0922   ALT 8 09/21/2019 0941   ALKPHOS 71 09/21/2019 0941   BILITOT 0.4 12/07/2021 0922   BILITOT 0.4 09/21/2019 0941   GFRNONAA 89 06/13/2020 0908   GFRAA 103 06/13/2020 0908     Lab Results  Component Value Date   WBC 3.4 (L) 06/15/2021   HGB 12.0 06/15/2021   HCT 36.0 06/15/2021   MCV 91.8 06/15/2021   PLT 237 06/15/2021    Lab Results  Component Value Date   CHOL 156 12/07/2021   HDL 65 12/07/2021   LDLCALC 75 12/07/2021   TRIG 76 12/07/2021   CHOLHDL 2.4 12/07/2021    Lab Results  Component Value Date   HGBA1C 5.9 (H) 12/07/2021     Lab Results  Component Value Date   TSH 1.97 06/15/2021      Assessment & Plan:   Covid-19 Virus Infection: Prescribed regular strength Paxlovid take as directed, GFR normal. Advised to get plenty of rest, drink water, stay well fed, quarantine for total of 5 days, take Tylenol for fever/myalgias.    I,Alexander Ruley,acting as a Education administrator for Elby Showers, MD.,have documented all relevant documentation on the behalf of Elby Showers, MD,as directed by  Elby Showers, MD while in the presence of Elby Showers, MD.   I, Elby Showers, MD, have reviewed all documentation for this visit. The documentation on 03/25/22 for the exam, diagnosis, procedures, and orders are all accurate and complete.

## 2022-03-25 NOTE — Patient Instructions (Addendum)
Brittany Bender, thank you for joining Perlie Mayo, NP for today's virtual visit.  While this provider is not your primary care provider (PCP), if your PCP is located in our provider database this encounter information will be shared with them immediately following your visit.   [facility] account gives you access to today's visit and all your visits, tests, and labs performed at [facility] " click here if you don't have a [facility] account or go to [email or web address]  Consent: (Patient) Brittany Bender provided verbal consent for this virtual visit at the beginning of the encounter.  Current Medications:  Current Outpatient Medications:    albuterol (VENTOLIN HFA) 108 (90 Base) MCG/ACT inhaler, Inhale 2 puffs into the lungs every 6 (six) hours as needed for wheezing or shortness of breath., Disp: 8 g, Rfl: 2   ALPRAZolam (XANAX) 0.5 MG tablet, Take 1 tablet (0.5 mg total) by mouth at bedtime as needed., Disp: 90 tablet, Rfl: 1   celecoxib (CELEBREX) 200 MG capsule, TAKE ONE CAPSULE BY MOUTH ONE TIME DAILY, Disp: 90 capsule, Rfl: 3   folic acid (FOLVITE) 1 MG tablet, TAKE ONE TABLET BY MOUTH ONE TIME DAILY, Disp: 90 tablet, Rfl: 3   levocetirizine (XYZAL) 5 MG tablet, take 1 tablet by mouth daily as needed for allergies, Disp: 90 tablet, Rfl: 3   LINZESS 290 MCG CAPS capsule, Take 1 capsule by mouth once a day before breakfast, Disp: 90 capsule, Rfl: 0   methocarbamol (ROBAXIN) 500 MG tablet, Take 500 mg by mouth every 8 (eight) hours as needed for muscle spasms., Disp: , Rfl:    metoprolol tartrate (LOPRESSOR) 25 MG tablet, TAKE ONE TABLET BY MOUTH TWICE DAILY, Disp: 180 tablet, Rfl: 0   montelukast (SINGULAIR) 10 MG tablet, take 1 tablet by mouth daily as needed, Disp: 90 tablet, Rfl: 3   Multiple Vitamins-Minerals (MULTIVITAMINS THER. W/MINERALS) TABS, Take 2 tablets by mouth daily., Disp: , Rfl:    OVER THE COUNTER MEDICATION, Take 1 tablet by  mouth as needed. Quick digest, Disp: , Rfl:    rosuvastatin (CRESTOR) 5 MG tablet, TAKE ONE TABLET BY MOUTH THREE TIMES A WEEK, Disp: 36 tablet, Rfl: 0   Semaglutide,0.25 or 0.'5MG'$ /DOS, (OZEMPIC, 0.25 OR 0.5 MG/DOSE,) 2 MG/3ML SOPN, Inject 1 Units into the skin once a week., Disp: , Rfl:    Vitamin D, Ergocalciferol, (DRISDOL) 1.25 MG (50000 UNIT) CAPS capsule, TAKE ONE CAPSULE BY MOUTH WEEKLY, Disp: 12 capsule, Rfl: 3   Medications ordered in this encounter:  No orders of the defined types were placed in this encounter.    *If you need refills on other medications prior to your next appointment, please contact your pharmacy*  Follow-Up: Call back or seek an in-person evaluation if the symptoms worsen or if the condition fails to improve as anticipated.  [facility] [phone]  Other Instructions   1. Neck pain  -continue tylenol as needed  -restart celebrex daily -use on hand flexeril  -continue heating pa -is not improving in 1-2 days follow up in person  2. Sore throat (viral)  -continue warm fluids -salt water gargles -if worsens or develop congestion, fevers and chills- retest for covid    If you have been instructed to have an in-person evaluation today at a local Urgent Care facility, please use the link below. It will take you to a list of all of our available [facility], including address, phone number and  hours of operation. Please do not delay care.  [facility]  If you or a family member do not have a primary care provider, use the link below to schedule a visit and establish care. When you choose a [facility] primary care physician or advanced practice provider, you gain a long-term partner in health. Find a Primary Care Provider  Learn more about [facility]'s in-office and virtual care options: [facility]

## 2022-03-25 NOTE — Patient Instructions (Signed)
Take Paxlovid regular strength as directed. GFR is normal. Quarantine for a total of 5 days. May take Tylenol for fever or myalgias. Stay well hydrated.

## 2022-04-02 DIAGNOSIS — Z6841 Body Mass Index (BMI) 40.0 and over, adult: Secondary | ICD-10-CM | POA: Diagnosis not present

## 2022-04-02 DIAGNOSIS — R7303 Prediabetes: Secondary | ICD-10-CM | POA: Diagnosis not present

## 2022-04-04 ENCOUNTER — Other Ambulatory Visit: Payer: Self-pay | Admitting: Internal Medicine

## 2022-04-21 ENCOUNTER — Other Ambulatory Visit: Payer: Self-pay | Admitting: Internal Medicine

## 2022-04-22 ENCOUNTER — Other Ambulatory Visit: Payer: Self-pay | Admitting: Internal Medicine

## 2022-04-24 ENCOUNTER — Other Ambulatory Visit: Payer: Self-pay | Admitting: Obstetrics

## 2022-04-24 DIAGNOSIS — J0111 Acute recurrent frontal sinusitis: Secondary | ICD-10-CM

## 2022-04-24 MED ORDER — AZITHROMYCIN 250 MG PO TABS: ORAL_TABLET | ORAL | 0 refills | Status: DC

## 2022-05-28 ENCOUNTER — Other Ambulatory Visit: Payer: Self-pay | Admitting: Internal Medicine

## 2022-05-28 DIAGNOSIS — G4733 Obstructive sleep apnea (adult) (pediatric): Secondary | ICD-10-CM | POA: Diagnosis not present

## 2022-05-28 DIAGNOSIS — E785 Hyperlipidemia, unspecified: Secondary | ICD-10-CM | POA: Diagnosis not present

## 2022-05-28 DIAGNOSIS — R7303 Prediabetes: Secondary | ICD-10-CM | POA: Diagnosis not present

## 2022-05-28 DIAGNOSIS — Z6841 Body Mass Index (BMI) 40.0 and over, adult: Secondary | ICD-10-CM | POA: Diagnosis not present

## 2022-05-28 DIAGNOSIS — K5903 Drug induced constipation: Secondary | ICD-10-CM | POA: Diagnosis not present

## 2022-05-29 ENCOUNTER — Encounter: Payer: Self-pay | Admitting: *Deleted

## 2022-05-29 NOTE — Progress Notes (Signed)
[facility] Quality Team Note  Name: Brittany Bender Date of Birth: 09/29/1950 MRN: [id number] Date: 05/29/2022  [facility] Quality Team has reviewed this patient's chart, please see recommendations below:  [facility] Quality Other; After reviewing the gap report, it looks like patient is due for A1C, EGFR, and Urine Albumin Creatinine Ratio.  Pt has ov for labs on 06/14/2022.  Would you consider adding these labs to the order?  Thank you so much!

## 2022-05-30 ENCOUNTER — Other Ambulatory Visit: Payer: Self-pay

## 2022-05-30 DIAGNOSIS — I5189 Other ill-defined heart diseases: Secondary | ICD-10-CM

## 2022-05-30 DIAGNOSIS — F411 Generalized anxiety disorder: Secondary | ICD-10-CM

## 2022-05-30 DIAGNOSIS — R7302 Impaired glucose tolerance (oral): Secondary | ICD-10-CM

## 2022-05-30 DIAGNOSIS — R5383 Other fatigue: Secondary | ICD-10-CM

## 2022-05-30 NOTE — Progress Notes (Signed)
See [facility] Quality Team Note
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
## 2022-06-14 ENCOUNTER — Other Ambulatory Visit: Payer: Medicare PPO

## 2022-06-14 DIAGNOSIS — R7302 Impaired glucose tolerance (oral): Secondary | ICD-10-CM | POA: Diagnosis not present

## 2022-06-14 DIAGNOSIS — Z136 Encounter for screening for cardiovascular disorders: Secondary | ICD-10-CM

## 2022-06-14 DIAGNOSIS — E7800 Pure hypercholesterolemia, unspecified: Secondary | ICD-10-CM

## 2022-06-14 DIAGNOSIS — R5383 Other fatigue: Secondary | ICD-10-CM | POA: Diagnosis not present

## 2022-06-14 DIAGNOSIS — R000 Tachycardia, unspecified: Secondary | ICD-10-CM

## 2022-06-15 LAB — HEMOGLOBIN A1C
Hgb A1c MFr Bld: 5.7 % of total Hgb — ABNORMAL HIGH (ref <5.7)
Mean Plasma Glucose: 117 mg/dL
eAG (mmol/L): 6.5 mmol/L

## 2022-06-15 LAB — MICROALBUMIN / CREATININE URINE RATIO
Creatinine, Urine: 171 mg/dL (ref 20–275)
Microalb Creat Ratio: 4 mg/g creat (ref <30)
Microalb, Ur: 0.6 mg/dL

## 2022-06-15 LAB — CBC WITH DIFFERENTIAL/PLATELET
Absolute Monocytes: 264 cells/uL (ref 200–950)
Basophils Absolute: 31 cells/uL (ref 0–200)
Basophils Relative: 1 %
Eosinophils Absolute: 112 cells/uL (ref 15–500)
Eosinophils Relative: 3.6 %
HCT: 36.5 % (ref 35.0–45.0)
Hemoglobin: 11.9 g/dL (ref 11.7–15.5)
Lymphs Abs: 1448 cells/uL (ref 850–3900)
MCH: 29.5 pg (ref 27.0–33.0)
MCHC: 32.6 g/dL (ref 32.0–36.0)
MCV: 90.6 fL (ref 80.0–100.0)
MPV: 10 fL (ref 7.5–12.5)
Monocytes Relative: 8.5 %
Neutro Abs: 1246 cells/uL — ABNORMAL LOW (ref 1500–7800)
Neutrophils Relative %: 40.2 %
Platelets: 244 10*3/uL (ref 140–400)
RBC: 4.03 10*6/uL (ref 3.80–5.10)
RDW: 15.4 % — ABNORMAL HIGH (ref 11.0–15.0)
Total Lymphocyte: 46.7 %
WBC: 3.1 10*3/uL — ABNORMAL LOW (ref 3.8–10.8)

## 2022-06-15 LAB — TSH: TSH: 1.51 mIU/L (ref 0.40–4.50)

## 2022-06-15 LAB — COMPLETE METABOLIC PANEL WITH GFR
AG Ratio: 1.3 (calc) (ref 1.0–2.5)
ALT: 8 U/L (ref 6–29)
AST: 15 U/L (ref 10–35)
Albumin: 4 g/dL (ref 3.6–5.1)
Alkaline phosphatase (APISO): 71 U/L (ref 37–153)
BUN: 12 mg/dL (ref 7–25)
CO2: 26 mmol/L (ref 20–32)
Calcium: 9 mg/dL (ref 8.6–10.4)
Chloride: 106 mmol/L (ref 98–110)
Creat: 0.69 mg/dL (ref 0.60–1.00)
Globulin: 3.1 g/dL (calc) (ref 1.9–3.7)
Glucose, Bld: 88 mg/dL (ref 65–99)
Potassium: 3.8 mmol/L (ref 3.5–5.3)
Sodium: 140 mmol/L (ref 135–146)
Total Bilirubin: 0.4 mg/dL (ref 0.2–1.2)
Total Protein: 7.1 g/dL (ref 6.1–8.1)
eGFR: 93 mL/min/{1.73_m2} (ref >=60)

## 2022-06-15 LAB — LIPID PANEL
Cholesterol: 180 mg/dL (ref <200)
HDL: 85 mg/dL (ref >=50)
LDL Cholesterol (Calc): 81 mg/dL (calc)
Non-HDL Cholesterol (Calc): 95 mg/dL (calc) (ref <130)
Total CHOL/HDL Ratio: 2.1 (calc) (ref <5.0)
Triglycerides: 55 mg/dL (ref <150)

## 2022-06-20 ENCOUNTER — Encounter: Payer: Self-pay | Admitting: Internal Medicine

## 2022-06-20 ENCOUNTER — Ambulatory Visit (INDEPENDENT_AMBULATORY_CARE_PROVIDER_SITE_OTHER): Payer: Medicare PPO | Admitting: Internal Medicine

## 2022-06-20 VITALS — BP 130/80 | HR 83 | Temp 98.3°F | Ht 64.75 in | Wt 249.0 lb

## 2022-06-20 DIAGNOSIS — R7302 Impaired glucose tolerance (oral): Secondary | ICD-10-CM | POA: Diagnosis not present

## 2022-06-20 DIAGNOSIS — J019 Acute sinusitis, unspecified: Secondary | ICD-10-CM | POA: Diagnosis not present

## 2022-06-20 DIAGNOSIS — M7918 Myalgia, other site: Secondary | ICD-10-CM

## 2022-06-20 DIAGNOSIS — Z0000 Encounter for general adult medical examination without abnormal findings: Secondary | ICD-10-CM | POA: Diagnosis not present

## 2022-06-20 DIAGNOSIS — E785 Hyperlipidemia, unspecified: Secondary | ICD-10-CM

## 2022-06-20 DIAGNOSIS — Z90722 Acquired absence of ovaries, bilateral: Secondary | ICD-10-CM | POA: Diagnosis not present

## 2022-06-20 DIAGNOSIS — K5909 Other constipation: Secondary | ICD-10-CM

## 2022-06-20 DIAGNOSIS — R053 Chronic cough: Secondary | ICD-10-CM

## 2022-06-20 DIAGNOSIS — Z90710 Acquired absence of both cervix and uterus: Secondary | ICD-10-CM

## 2022-06-20 DIAGNOSIS — G4733 Obstructive sleep apnea (adult) (pediatric): Secondary | ICD-10-CM

## 2022-06-20 DIAGNOSIS — Z8659 Personal history of other mental and behavioral disorders: Secondary | ICD-10-CM

## 2022-06-20 DIAGNOSIS — E669 Obesity, unspecified: Secondary | ICD-10-CM

## 2022-06-20 DIAGNOSIS — Z67A1 Duffy null: Secondary | ICD-10-CM

## 2022-06-20 DIAGNOSIS — Z6841 Body Mass Index (BMI) 40.0 and over, adult: Secondary | ICD-10-CM

## 2022-06-20 DIAGNOSIS — Z8781 Personal history of (healed) traumatic fracture: Secondary | ICD-10-CM

## 2022-06-20 DIAGNOSIS — Z9079 Acquired absence of other genital organ(s): Secondary | ICD-10-CM

## 2022-06-20 DIAGNOSIS — J4520 Mild intermittent asthma, uncomplicated: Secondary | ICD-10-CM

## 2022-06-20 DIAGNOSIS — Z853 Personal history of malignant neoplasm of breast: Secondary | ICD-10-CM

## 2022-06-20 DIAGNOSIS — Z9889 Other specified postprocedural states: Secondary | ICD-10-CM

## 2022-06-20 DIAGNOSIS — D708 Other neutropenia: Secondary | ICD-10-CM

## 2022-06-20 DIAGNOSIS — F419 Anxiety disorder, unspecified: Secondary | ICD-10-CM

## 2022-06-20 DIAGNOSIS — E7800 Pure hypercholesterolemia, unspecified: Secondary | ICD-10-CM

## 2022-06-20 DIAGNOSIS — J302 Other seasonal allergic rhinitis: Secondary | ICD-10-CM

## 2022-06-20 LAB — POCT URINALYSIS DIPSTICK
Bilirubin, UA: NEGATIVE
Blood, UA: NEGATIVE
Glucose, UA: NEGATIVE
Ketones, UA: NEGATIVE
Leukocytes, UA: NEGATIVE
Nitrite, UA: NEGATIVE
Protein, UA: NEGATIVE
Spec Grav, UA: 1.015 (ref 1.010–1.025)
Urobilinogen, UA: 0.2 E.U./dL
pH, UA: 6.5 (ref 5.0–8.0)

## 2022-06-21 ENCOUNTER — Encounter: Payer: Self-pay | Admitting: Internal Medicine

## 2022-06-25 NOTE — Telephone Encounter (Signed)
Brittany Bender called and said she was to get a cough syrup that was yellow and a prednisone dosepak sent into [facility] last week.

## 2022-06-27 ENCOUNTER — Other Ambulatory Visit: Payer: Self-pay | Admitting: Internal Medicine

## 2022-07-01 DIAGNOSIS — K5903 Drug induced constipation: Secondary | ICD-10-CM | POA: Diagnosis not present

## 2022-07-01 DIAGNOSIS — G4733 Obstructive sleep apnea (adult) (pediatric): Secondary | ICD-10-CM | POA: Diagnosis not present

## 2022-07-01 DIAGNOSIS — Z6841 Body Mass Index (BMI) 40.0 and over, adult: Secondary | ICD-10-CM | POA: Diagnosis not present

## 2022-07-01 DIAGNOSIS — E785 Hyperlipidemia, unspecified: Secondary | ICD-10-CM | POA: Diagnosis not present

## 2022-07-01 DIAGNOSIS — R7303 Prediabetes: Secondary | ICD-10-CM | POA: Diagnosis not present

## 2022-07-01 MED ORDER — HYDROCOD POLI-CHLORPHE POLI ER 10-8 MG/5ML PO SUER: 5.0000 mL | Freq: Two times a day (BID) | ORAL | 0 refills | Status: DC | PRN

## 2022-07-01 MED ORDER — METHYLPREDNISOLONE 4 MG PO TABS: ORAL_TABLET | ORAL | 0 refills | Status: DC

## 2022-07-01 NOTE — Telephone Encounter (Addendum)
Brittany Bender has called back this morning say she never got her medication and she is still coughing. She said cough syrup was going to be yellow and then a prednisone Dosepak sent to [facility] from her last office visit..  Have sent in Tussionex and Medrol dosepack 6 day taper. MJB, MD

## 2022-07-20 NOTE — Patient Instructions (Addendum)
It was a pleasure to see you today.  Return in 1 year or as needed.  Chest x-ray has been ordered.  Please take Tussionex sparingly for cough, Medrol Dosepak and tapering course and continue Singulair orally and albuterol inhaler.  Continue current medications as previously prescribed.  Continue  diet and exercise regimen.

## 2022-08-21 DIAGNOSIS — K5903 Drug induced constipation: Secondary | ICD-10-CM | POA: Diagnosis not present

## 2022-08-21 DIAGNOSIS — R7303 Prediabetes: Secondary | ICD-10-CM | POA: Diagnosis not present

## 2022-08-21 DIAGNOSIS — E785 Hyperlipidemia, unspecified: Secondary | ICD-10-CM | POA: Diagnosis not present

## 2022-08-21 DIAGNOSIS — Z6841 Body Mass Index (BMI) 40.0 and over, adult: Secondary | ICD-10-CM | POA: Diagnosis not present

## 2022-08-21 DIAGNOSIS — G4733 Obstructive sleep apnea (adult) (pediatric): Secondary | ICD-10-CM | POA: Diagnosis not present

## 2022-08-27 ENCOUNTER — Other Ambulatory Visit: Payer: Self-pay

## 2022-08-27 ENCOUNTER — Encounter: Payer: Self-pay | Admitting: Allergy and Immunology

## 2022-08-27 ENCOUNTER — Other Ambulatory Visit: Payer: Self-pay | Admitting: Internal Medicine

## 2022-08-27 ENCOUNTER — Ambulatory Visit (INDEPENDENT_AMBULATORY_CARE_PROVIDER_SITE_OTHER): Payer: Medicare PPO | Admitting: Allergy and Immunology

## 2022-08-27 VITALS — BP 118/74 | HR 89 | Temp 98.3°F | Resp 16 | Ht 64.17 in | Wt 246.1 lb

## 2022-08-27 DIAGNOSIS — J4530 Mild persistent asthma, uncomplicated: Secondary | ICD-10-CM

## 2022-08-27 DIAGNOSIS — Z1231 Encounter for screening mammogram for malignant neoplasm of breast: Secondary | ICD-10-CM

## 2022-08-27 DIAGNOSIS — K219 Gastro-esophageal reflux disease without esophagitis: Secondary | ICD-10-CM

## 2022-08-27 DIAGNOSIS — J3089 Other allergic rhinitis: Secondary | ICD-10-CM

## 2022-08-27 MED ORDER — LEVOCETIRIZINE DIHYDROCHLORIDE 5 MG PO TABS: 5.0000 mg | ORAL_TABLET | Freq: Every day | ORAL | 1 refills | Status: DC | PRN

## 2022-08-27 MED ORDER — ALBUTEROL SULFATE HFA 108 (90 BASE) MCG/ACT IN AERS: 2.0000 | INHALATION_SPRAY | Freq: Four times a day (QID) | RESPIRATORY_TRACT | 1 refills | Status: DC | PRN

## 2022-08-27 MED ORDER — ARNUITY ELLIPTA 100 MCG/ACT IN AEPB: 1.0000 | INHALATION_SPRAY | Freq: Every morning | RESPIRATORY_TRACT | 1 refills | Status: DC

## 2022-08-27 MED ORDER — FAMOTIDINE 40 MG PO TABS: 40.0000 mg | ORAL_TABLET | Freq: Every evening | ORAL | 1 refills | Status: DC

## 2022-08-27 MED ORDER — FLUTICASONE PROPIONATE 50 MCG/ACT NA SUSP: 2.0000 | Freq: Every day | NASAL | 1 refills | Status: DC

## 2022-08-27 MED ORDER — OMEPRAZOLE 40 MG PO CPDR: 40.0000 mg | DELAYED_RELEASE_CAPSULE | Freq: Every morning | ORAL | 1 refills | Status: DC

## 2022-08-27 NOTE — Patient Instructions (Addendum)
  1. Allergen avoidance measures  2. Treat and prevent airway inflammation:   A. Fluticasone - 2 sprays each nostril 1 time per day  B. Arnuity 100 - 1 inhalation 1 time per day  3. Treat and prevent reflux induced airway inflammation:   A. Omeprazole 40 mg - 1 tablet 1 in AM  B. Famotidine 40mg  - 1 tablet in PM  4. If needed:   A. Albuterol - 2 inhalations every 4-6 hours  B. OTC antihistamine  5. Does montelukast help???  6. Return to clinic in 4 weeks or earlier if problem

## 2022-08-27 NOTE — Progress Notes (Unsigned)
Brittany Bender - [place] - [place]   Dear Dr. Lenord Fellers,  Thank you for referring Brittany Bender to the [facility] [place] on 08/27/2022.   Below is a summation of this patient's evaluation and recommendations.  Thank you for your referral. I will keep you informed about this patient's response to treatment.   If you have any questions please do not hesitate to contact me.   Sincerely,  Jessica Priest, MD Allergy / Immunology [place] [facility]   ______________________________________________________________________    NEW PATIENT NOTE  Referring Provider: Margaree Mackintosh, MD Primary Provider: Margaree Mackintosh, MD Date of office visit: 08/27/2022    Subjective:   Chief Complaint:  Brittany Bender (DOB: [birth year]) is a 72 y.o. female who presents to the clinic on 08/27/2022 with a chief complaint of Cough (Says she had a cough for a long lime. Was diagnosed with asthma a while ago. ), Allergic Rhinitis  (Says she has congestion and postnasal drip more often now. ), and Asthma (Says she was diagnosed with asthma by her PCP. Says she only has issues in the winter. ) .     HPI: Brittany Bender presents to this clinic in evaluation of coughing.  She has a long history of mild asthma and nasal symptoms with some stuffiness and sneezing that was an intermittent issue and did not really require much treatment throughout the year but occasionally she would get "bronchitis" and received an antibiotic.  She did have an inhaler but she rarely used that inhaler.  Unfortunately, something changed in April 2024.  At that point in time she contracted a head cold with some nasal congestion and stuffiness and cough.  She was treated with 2 Z-Pak's and cough medicines and she has never really had improvement since that point.  She still is stuffy and she still has some sneezing and she feels nasal all the time and  she coughs a lot.  She does not have any anosmia or headaches or ugly nasal discharge and she does have posttussive micturition but no posttussive vomiting.  She does not have any chest tightness or shortness of breath.  She may have a tickle in her throat but no throat clearing.  When she uses an albuterol inhaler it may actually help.  She does have reflux with regurgitation and burning especially if she eats spicy food.  She has caffeine about twice a week and does not consume any chocolate.  She has wine or beer or cocktail about 1 time per week.  It is interesting to note that in late April she went on a cruise for 12 days and while out on the water she did not cough very much at all.  Past Medical History:  Diagnosis Date   Allergy    Anemia    Anxiety    xanax used primarily for sleep    Arthritis    all over, cervical spine- stenosis    Asthma    last SOB was back in the summer, Never had "attack"   Cancer [facility])    right breast cancer 2000-had radiation   GERD (gastroesophageal reflux disease)    Hypertension    prompted by having palpitations to see Dr,. Mendel Ryder, he only needs to follow up with her on PRN basis   Personal history of radiation therapy 2000   PONV (postoperative nausea and vomiting)    Sleep  apnea    has OSA- uses CPAP but not for last 2 weeks, due to sinus issues     Past Surgical History:  Procedure Laterality Date   ABDOMINAL HYSTERECTOMY     2004   ANTERIOR CERVICAL DECOMP/DISCECTOMY FUSION N/A 12/27/2013   Procedure: CERVICAL FOUR TO FIVE, CERVICAL FIVE TO SIX ANTERIOR CERVICAL DECOMPRESSION/DISCECTOMY FUSION 2 LEVELS;  Surgeon: Reinaldo Meeker, MD;  Location: [facility];  Service: Neurosurgery;  Laterality: N/A;   BACK SURGERY     BREAST BIOPSY Left 09/07/2013   x2   BREAST BIOPSY Left 12/13/2021   MM LT BREAST BX W LOC DEV 1ST LESION IMAGE BX SPEC STEREO GUIDE 12/13/2021 [facility]   BREAST BIOPSY Left 12/13/2021   MM LT BREAST BX W LOC  DEV EA AD LESION IMG BX SPEC STEREO GUIDE 12/13/2021 [facility]   BREAST LUMPECTOMY Right 2000   BREAST SURGERY Right 2000   right lumpectomy 2000   COLONOSCOPY     FOOT GANGLION EXCISION     right and left at different times   FOOT SURGERY     3 yrs ago broke left foot had hardware inserted and then had to have it removed   KIDNEY SURGERY     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   ALPRAZolam 0.5 MG tablet Commonly known as: XANAX Take 1 tablet (0.5 mg total) by mouth at bedtime as needed.   celecoxib 200 MG capsule Commonly known as: CELEBREX TAKE ONE CAPSULE BY MOUTH ONE TIME DAILY   folic acid 1 MG tablet Commonly known as: FOLVITE TAKE ONE TABLET BY MOUTH ONE TIME DAILY   Linzess 290 MCG Caps capsule Generic drug: linaclotide Take 1 capsule by mouth once a day before breakfast   metoprolol tartrate 25 MG tablet Commonly known as: LOPRESSOR TAKE ONE TABLET BY MOUTH TWICE DAILY   montelukast 10 MG tablet Commonly known as: SINGULAIR take 1 tablet by mouth daily as needed   multivitamins ther. w/minerals Tabs tablet Take 2 tablets by mouth daily.   OVER THE COUNTER MEDICATION Take 1 tablet by mouth as needed. Quick digest   Ozempic (0.25 or 0.5 MG/DOSE) 2 MG/3ML Sopn Generic drug: Semaglutide(0.25 or 0.5MG /DOS) Inject 1 Units into the skin once a week.    rosuvastatin 5 MG tablet Commonly known as: CRESTOR TAKE 1 TABLET BY MOUTH 3 TIMES A WEEK   Vitamin D (Ergocalciferol) 1.25 MG (50000 UNIT) Caps capsule Commonly known as: DRISDOL take 1 capsule by mouth once a week    Review of systems negative except as noted in HPI / PMHx or noted below:  Review of Systems  Constitutional: Negative.   HENT: Negative.    Eyes: Negative.   Respiratory: Negative.    Cardiovascular: Negative.   Gastrointestinal: Negative.   Genitourinary: Negative.   Musculoskeletal: Negative.   Skin: Negative.  Neurological: Negative.   Endo/Heme/Allergies: Negative.   Psychiatric/Behavioral: Negative.      Family History  Problem Relation Age of Onset   Asthma Father    Heart disease Sister        heart attack x 2   Heart disease Brother        bypass   Breast cancer Maternal Grandmother    Colon cancer Neg Hx    Colon polyps Neg Hx    Esophageal cancer Neg Hx    Rectal cancer Neg Hx    Stomach cancer Neg Hx     Social History   Socioeconomic History   Marital status: Divorced    Spouse name: Not on file   Number of children: Not on file   Years of education: Not on file   Highest education level: Not on file  Occupational History   Occupation: Magazine features editor: [employer]  Tobacco Use   Smoking status: Never    Passive exposure: Past   Smokeless tobacco: Never  Vaping Use   Vaping status: Never Used  Substance and Sexual Activity   Alcohol use: Yes    Comment: occasional   Drug use: No   Sexual activity: Not on file  Other Topics Concern   Not on file  Social History Narrative   Not on file   Environmental and Social history  Lives in a townhouse with a dry environment, no animals located inside the household, no carpet in the bedroom, plastic on the bed, plastic on the pillow, no smoking ongoing with inside the household.  Objective:   Vitals:   08/27/22 1405  BP: 118/74  Pulse: 89  Resp: 16  Temp:  98.3 F (36.8 C)  SpO2: 98%   Height: 5' 4.17" (163 cm) Weight: 246 lb 1.6 oz (111.6 kg)  Physical Exam Constitutional:      Appearance: She is not diaphoretic.  HENT:     Head: Normocephalic.     Right Ear: Tympanic membrane, ear canal and external ear normal.     Left Ear: Tympanic membrane, ear canal and external ear normal.     Nose: Nose normal. No mucosal edema or rhinorrhea.     Mouth/Throat:     Pharynx: Uvula midline. No oropharyngeal exudate.  Eyes:     Conjunctiva/sclera: Conjunctivae normal.  Neck:     Thyroid: No thyromegaly.     Trachea: Trachea normal. No tracheal tenderness or tracheal deviation.  Cardiovascular:     Rate and Rhythm: Normal rate and regular rhythm.     Heart sounds: Normal heart sounds, S1 normal and S2 normal. No murmur heard. Pulmonary:     Effort: No respiratory distress.     Breath sounds: Normal breath sounds. No stridor. No wheezing or rales.  Lymphadenopathy:     Head:     Right side of head: No tonsillar adenopathy.     Left side of head: No tonsillar adenopathy.     Cervical: No cervical adenopathy.  Skin:    Findings: No erythema or rash.     Nails: There is no clubbing.  Neurological:     Mental Status: She is alert.     Diagnostics: Allergy skin tests were performed.  She demonstrated hypersensitivity against house dust mite.  Spirometry was performed and demonstrated an FEV1 of 2.21 @ 116 % of predicted. FEV1/FVC = 0.88  Assessment and Plan:    1. Not well controlled mild persistent asthma   2. Perennial allergic  rhinitis   3. LPRD (laryngopharyngeal reflux disease)    1. Allergen avoidance measures - dust mite  2. Treat and prevent airway inflammation:   A. Fluticasone - 2 sprays each nostril 1 time per day  B. Arnuity 100 - 1 inhalation 1 time per day  3. Treat and prevent reflux induced airway inflammation:   A. Omeprazole 40 mg - 1 tablet 1 in AM  B. Famotidine 40mg  - 1 tablet in PM  4. If needed:   A.  Albuterol - 2 inhalations every 4-6 hours  B. OTC antihistamine  5. Does montelukast help???  6. Return to clinic in 4 weeks or earlier if problem  Makyna has a multifactorial form of cough with contribution from atopic disease and reflux disease and we will treat her with a combination of therapy noted above which includes allergen avoidance measures, anti-inflammatory agents for both her upper and lower airway, and aggressive treatment directed against reflux.  Montelukast has not helped her at all and she can discontinue this agent.  We will hold off on any imaging of her airway until we see her back in this clinic in 4 weeks to assess her response to the plan noted above.   Jessica Priest, MD Allergy / Immunology [place] [facility] [place]

## 2022-08-28 ENCOUNTER — Other Ambulatory Visit: Payer: Self-pay | Admitting: Internal Medicine

## 2022-08-28 ENCOUNTER — Encounter: Payer: Self-pay | Admitting: Allergy and Immunology

## 2022-09-08 ENCOUNTER — Telehealth: Payer: Self-pay

## 2022-09-08 NOTE — Progress Notes (Signed)
Pharmacy Quality Measure Review  This patient is appearing on a report for being at risk of failing the adherence measure for diabetes medications this calendar year.   Medication: semaglutide 2 mg weekly Last fill date: 05/10/22 for 84 day supply  Patient returned call and reported that she has a full box left of Ozempic. She receives this Rx from [facility] at [place]. No pharmacy f/u needed at this time.  Nils Pyle, PharmD PGY1 Pharmacy Resident

## 2022-09-14 ENCOUNTER — Other Ambulatory Visit: Payer: Self-pay | Admitting: Family

## 2022-10-08 ENCOUNTER — Encounter: Payer: Self-pay | Admitting: Allergy and Immunology

## 2022-10-08 ENCOUNTER — Ambulatory Visit: Payer: Medicare PPO | Admitting: Allergy and Immunology

## 2022-10-08 ENCOUNTER — Other Ambulatory Visit: Payer: Self-pay

## 2022-10-08 VITALS — BP 116/74 | HR 88 | Temp 97.8°F | Resp 16 | Ht 64.0 in | Wt 245.5 lb

## 2022-10-08 DIAGNOSIS — K219 Gastro-esophageal reflux disease without esophagitis: Secondary | ICD-10-CM | POA: Diagnosis not present

## 2022-10-08 DIAGNOSIS — J3089 Other allergic rhinitis: Secondary | ICD-10-CM | POA: Diagnosis not present

## 2022-10-08 DIAGNOSIS — J4530 Mild persistent asthma, uncomplicated: Secondary | ICD-10-CM

## 2022-10-08 NOTE — Progress Notes (Unsigned)
[place] - [place] - [place]   Follow-up Note  Referring Provider: Margaree Mackintosh, MD Primary Provider: Margaree Mackintosh, MD Date of Office Visit: 10/08/2022  Subjective:   Brittany Bender (DOB: [birth year]) is a 72 y.o. female who [facility] the [facility] on 10/08/2022 in re-evaluation of the following:  HPI: Brittany Bender returns to this clinic in reevaluation of atopic respiratory disease and reflux induced respiratory disease.  I last saw her in this clinic during her initial evaluation of 27 August 2022.  She has resolved her cough.  She is resolved the tickle in her throat.  She still feels a little stuffy at times but she is definitely better with her upper airway.  She has resolved her regurgitation for the most part.  She has performed house dust mite avoidance measures, has been consistently using anti-inflammatory agents for her upper and lower airway, and has been aggressively treating reflux.  Allergies as of 10/08/2022       Reactions   Codeine Other (See Comments)   Headache        Medication List     albuterol 108 (90 Base) MCG/ACT inhaler Commonly known as: VENTOLIN HFA Inhale 2 puffs into the lungs every 6 (six) hours as needed for wheezing or shortness of breath.   ALPRAZolam 0.5 MG tablet Commonly known as: XANAX Take 1 tablet (0.5 mg total) by mouth at bedtime as needed.   Arnuity Ellipta 100 MCG/ACT Aepb Generic drug: Fluticasone Furoate Inhale 1 Inhalation into the lungs every morning.   celecoxib 200 MG capsule Commonly known as: CELEBREX TAKE ONE CAPSULE BY MOUTH ONE TIME DAILY   famotidine 40 MG tablet Commonly known as: PEPCID Take 1 tablet (40 mg total) by mouth at bedtime.   fluticasone 50 MCG/ACT nasal spray Commonly known as: FLONASE Place 2 sprays into both nostrils daily.   folic acid 1 MG tablet Commonly known as: FOLVITE TAKE ONE TABLET BY MOUTH ONE TIME DAILY   levocetirizine 5 MG  tablet Commonly known as: XYZAL Take 1 tablet (5 mg total) by mouth daily as needed (Can take an extra dose during flare ups.).   Linzess 290 MCG Caps capsule Generic drug: linaclotide Take 1 capsule by mouth once a day before breakfast   metoprolol tartrate 25 MG tablet Commonly known as: LOPRESSOR TAKE ONE TABLET BY MOUTH TWICE DAILY   multivitamins ther. w/minerals Tabs tablet Take 2 tablets by mouth daily.   omeprazole 40 MG capsule Commonly known as: PRILOSEC Take 1 capsule (40 mg total) by mouth every morning.   Ozempic (0.25 or 0.5 MG/DOSE) 2 MG/3ML Sopn Generic drug: Semaglutide(0.25 or 0.5MG /DOS) Inject 1 Units into the skin once a week.   rosuvastatin 5 MG tablet Commonly known as: CRESTOR TAKE 1 TABLET BY MOUTH 3 TIMES A WEEK   Vitamin D (Ergocalciferol) 1.25 MG (50000 UNIT) Caps capsule Commonly known as: DRISDOL take 1 capsule by mouth once a week    Past Medical History:  Diagnosis Date   Allergy    Anemia    Anxiety    xanax used primarily for sleep    Arthritis    all over, cervical spine- stenosis    Asthma    last SOB was back in the summer, Never had "attack"   Cancer [facility])    right breast cancer 2000-had radiation   GERD (gastroesophageal reflux disease)    Hypertension    prompted by having palpitations to see  Dr,. Mendel Ryder, he only needs to follow up with her on PRN basis   Personal history of radiation therapy 2000   PONV (postoperative nausea and vomiting)    Sleep apnea    has OSA- uses CPAP but not for last 2 weeks, due to sinus issues     Past Surgical History:  Procedure Laterality Date   ABDOMINAL HYSTERECTOMY     2004   ANTERIOR CERVICAL DECOMP/DISCECTOMY FUSION N/A 12/27/2013   Procedure: CERVICAL FOUR TO FIVE, CERVICAL FIVE TO SIX ANTERIOR CERVICAL DECOMPRESSION/DISCECTOMY FUSION 2 LEVELS;  Surgeon: Reinaldo Meeker, MD;  Location: [facility];  Service: Neurosurgery;  Laterality: N/A;   BACK SURGERY     BREAST BIOPSY Left  09/07/2013   x2   BREAST BIOPSY Left 12/13/2021   MM LT BREAST BX W LOC DEV 1ST LESION IMAGE BX SPEC STEREO GUIDE 12/13/2021 [facility]   BREAST BIOPSY Left 12/13/2021   MM LT BREAST BX W LOC DEV EA AD LESION IMG BX SPEC STEREO GUIDE 12/13/2021 [facility]   BREAST LUMPECTOMY Right 2000   BREAST SURGERY Right 2000   right lumpectomy 2000   COLONOSCOPY     FOOT GANGLION EXCISION     right and left at different times   FOOT SURGERY     3 yrs ago broke left foot had hardware inserted and then had to have it removed   KIDNEY SURGERY     whole repaired in kidney unknown which one at age 3   KNEE ARTHROSCOPY Left 2013   LUMBAR LAMINECTOMY/DECOMPRESSION MICRODISCECTOMY  12/24/2010   Procedure: LUMBAR LAMINECTOMY/DECOMPRESSION MICRODISCECTOMY;  Surgeon: Clydene Fake;  Location: [facility];  Service: Neurosurgery;  Laterality: Right;  RIGHT LUMBAR FOUR-FIVE LAMINECTOMY/DISCECTOMY    Review of systems negative except as noted in HPI / PMHx or noted below:  Review of Systems  Constitutional: Negative.   HENT: Negative.    Eyes: Negative.   Respiratory: Negative.    Cardiovascular: Negative.   Gastrointestinal: Negative.   Genitourinary: Negative.   Musculoskeletal: Negative.   Skin: Negative.   Neurological: Negative.   Endo/Heme/Allergies: Negative.   Psychiatric/Behavioral: Negative.       Objective:   Vitals:   10/08/22 1503  BP: 116/74  Pulse: 88  Resp: 16  Temp: 97.8 F (36.6 C)  SpO2: 97%   Height: 5\' 4"  (162.6 cm)  Weight: 245 lb 8 oz (111.4 kg)   Physical Exam Constitutional:      Appearance: She is not diaphoretic.  HENT:     Head: Normocephalic.     Right Ear: Tympanic membrane, ear canal and external ear normal.     Left Ear: Tympanic membrane, ear canal and external ear normal.     Nose: Nose normal. No mucosal edema or rhinorrhea.     Mouth/Throat:     Pharynx: Uvula midline. No oropharyngeal exudate.  Eyes:     Conjunctiva/sclera:  Conjunctivae normal.  Neck:     Thyroid: No thyromegaly.     Trachea: Trachea normal. No tracheal tenderness or tracheal deviation.  Cardiovascular:     Rate and Rhythm: Normal rate and regular rhythm.     Heart sounds: Normal heart sounds, S1 normal and S2 normal. No murmur heard. Pulmonary:     Effort: No respiratory distress.     Breath sounds: Normal breath sounds. No stridor. No wheezing or rales.  Lymphadenopathy:     Head:     Right side of head: No tonsillar adenopathy.  Left side of head: No tonsillar adenopathy.     Cervical: No cervical adenopathy.  Skin:    Findings: No erythema or rash.     Nails: There is no clubbing.  Neurological:     Mental Status: She is alert.     Diagnostics:    Spirometry was performed and demonstrated an FEV1 of 1.98 at 104 % of predicted.  Assessment and Plan:   1. Asthma, well controlled, mild persistent   2. Perennial allergic rhinitis   3. LPRD (laryngopharyngeal reflux disease)    1. Allergen avoidance measures - dust mite  2. Treat and prevent airway inflammation:   A. Fluticasone - 2 sprays each nostril 1 time per day  B. Arnuity 100 - 1 inhalation 1 time per day  3. Treat and prevent reflux induced airway inflammation:   A. Omeprazole 40 mg - 1 tablet 1 in AM  B. Famotidine 40mg  - 1 tablet in PM  4. If needed:   A. Albuterol - 2 inhalations every 4-6 hours  B. OTC antihistamine  5. Plan for flu vaccine and RSV vaccine  6. Return to clinic in November 2024 or earlier if problem. Taper medications???  Aubriauna is doing better and I think a lot of the irritation and inflammation of her airway has decreased as a result of her plan noted above and I would like to continue to have her use this plan until November 2024 at which time we will see her back in this clinic and will attempt to taper down some of her medications.  If she still is stuffy regarding her upper airway then we may image her upper airway to rule out the  possibility of chronic sinusitis.  Brittany Schimke, MD Allergy / Immunology [facility]

## 2022-10-08 NOTE — Patient Instructions (Addendum)
  1. Allergen avoidance measures - dust mite  2. Treat and prevent airway inflammation:   A. Fluticasone - 2 sprays each nostril 1 time per day  B. Arnuity 100 - 1 inhalation 1 time per day  3. Treat and prevent reflux induced airway inflammation:   A. Omeprazole 40 mg - 1 tablet 1 in AM  B. Famotidine 40mg  - 1 tablet in PM  4. If needed:   A. Albuterol - 2 inhalations every 4-6 hours  B. OTC antihistamine  5. Plan for flu vaccine and RSV vaccine  6. Return to clinic in November 2024 or earlier if problem. Taper medications???

## 2022-10-09 ENCOUNTER — Encounter: Payer: Self-pay | Admitting: Allergy and Immunology

## 2022-10-09 MED ORDER — FLUTICASONE PROPIONATE 50 MCG/ACT NA SUSP: 2.0000 | Freq: Every day | NASAL | 1 refills | Status: DC

## 2022-10-09 MED ORDER — FAMOTIDINE 40 MG PO TABS: 40.0000 mg | ORAL_TABLET | Freq: Every evening | ORAL | 1 refills | Status: DC

## 2022-10-09 MED ORDER — LEVOCETIRIZINE DIHYDROCHLORIDE 5 MG PO TABS: 5.0000 mg | ORAL_TABLET | Freq: Every day | ORAL | 1 refills | Status: DC | PRN

## 2022-10-09 MED ORDER — ARNUITY ELLIPTA 100 MCG/ACT IN AEPB: 1.0000 | INHALATION_SPRAY | Freq: Every morning | RESPIRATORY_TRACT | 1 refills | Status: DC

## 2022-10-09 MED ORDER — ALBUTEROL SULFATE HFA 108 (90 BASE) MCG/ACT IN AERS: 2.0000 | INHALATION_SPRAY | Freq: Four times a day (QID) | RESPIRATORY_TRACT | 1 refills | Status: DC | PRN

## 2022-10-15 ENCOUNTER — Other Ambulatory Visit: Payer: Self-pay

## 2022-10-15 MED ORDER — VITAMIN D (ERGOCALCIFEROL) 1.25 MG (50000 UNIT) PO CAPS: 50000.0000 [IU] | ORAL_CAPSULE | ORAL | 0 refills | Status: DC

## 2022-10-16 DIAGNOSIS — G4733 Obstructive sleep apnea (adult) (pediatric): Secondary | ICD-10-CM | POA: Diagnosis not present

## 2022-10-16 DIAGNOSIS — K5903 Drug induced constipation: Secondary | ICD-10-CM | POA: Diagnosis not present

## 2022-10-16 DIAGNOSIS — R7303 Prediabetes: Secondary | ICD-10-CM | POA: Diagnosis not present

## 2022-10-16 DIAGNOSIS — E785 Hyperlipidemia, unspecified: Secondary | ICD-10-CM | POA: Diagnosis not present

## 2022-10-16 DIAGNOSIS — Z6841 Body Mass Index (BMI) 40.0 and over, adult: Secondary | ICD-10-CM | POA: Diagnosis not present

## 2022-10-19 ENCOUNTER — Other Ambulatory Visit: Payer: Self-pay | Admitting: Internal Medicine

## 2022-11-08 ENCOUNTER — Other Ambulatory Visit: Payer: Self-pay

## 2022-11-08 MED ORDER — METOPROLOL TARTRATE 25 MG PO TABS: 25.0000 mg | ORAL_TABLET | Freq: Two times a day (BID) | ORAL | 2 refills | Status: DC

## 2022-11-08 MED ORDER — FOLIC ACID 1 MG PO TABS: 1.0000 mg | ORAL_TABLET | Freq: Every day | ORAL | 99 refills | Status: DC

## 2022-11-08 MED ORDER — VITAMIN D (ERGOCALCIFEROL) 1.25 MG (50000 UNIT) PO CAPS: 50000.0000 [IU] | ORAL_CAPSULE | ORAL | 0 refills | Status: DC

## 2022-11-13 ENCOUNTER — Other Ambulatory Visit: Payer: Self-pay

## 2022-11-13 MED ORDER — CELECOXIB 200 MG PO CAPS: 200.0000 mg | ORAL_CAPSULE | Freq: Every day | ORAL | 3 refills | Status: DC

## 2022-11-25 ENCOUNTER — Ambulatory Visit
Admission: RE | Admit: 2022-11-25 | Discharge: 2022-11-25 | Disposition: A | Discharge status: Home / Self Care | Payer: Medicare PPO | Source: Ambulatory Visit | Attending: Internal Medicine | Admitting: Internal Medicine

## 2022-11-25 DIAGNOSIS — Z1231 Encounter for screening mammogram for malignant neoplasm of breast: Secondary | ICD-10-CM | POA: Diagnosis not present

## 2022-11-29 ENCOUNTER — Other Ambulatory Visit: Payer: Self-pay | Admitting: *Deleted

## 2022-11-29 MED ORDER — ARNUITY ELLIPTA 100 MCG/ACT IN AEPB: 1.0000 | INHALATION_SPRAY | Freq: Every morning | RESPIRATORY_TRACT | 1 refills | Status: DC

## 2022-11-29 MED ORDER — ALBUTEROL SULFATE HFA 108 (90 BASE) MCG/ACT IN AERS: 2.0000 | INHALATION_SPRAY | Freq: Four times a day (QID) | RESPIRATORY_TRACT | 1 refills | Status: DC | PRN

## 2022-11-29 MED ORDER — OMEPRAZOLE 40 MG PO CPDR: 40.0000 mg | DELAYED_RELEASE_CAPSULE | Freq: Every morning | ORAL | 1 refills | Status: DC

## 2022-11-29 MED ORDER — FAMOTIDINE 40 MG PO TABS: 40.0000 mg | ORAL_TABLET | Freq: Every evening | ORAL | 1 refills | Status: DC

## 2022-12-17 ENCOUNTER — Ambulatory Visit: Payer: Medicare PPO | Admitting: Allergy and Immunology

## 2022-12-17 VITALS — BP 132/80 | HR 82 | Temp 98.2°F | Resp 18

## 2022-12-17 DIAGNOSIS — K219 Gastro-esophageal reflux disease without esophagitis: Secondary | ICD-10-CM | POA: Diagnosis not present

## 2022-12-17 DIAGNOSIS — J3089 Other allergic rhinitis: Secondary | ICD-10-CM

## 2022-12-17 DIAGNOSIS — J4530 Mild persistent asthma, uncomplicated: Secondary | ICD-10-CM | POA: Diagnosis not present

## 2022-12-17 MED ORDER — FAMOTIDINE 40 MG PO TABS: 40.0000 mg | ORAL_TABLET | Freq: Every evening | ORAL | 1 refills | Status: DC

## 2022-12-17 MED ORDER — ARNUITY ELLIPTA 100 MCG/ACT IN AEPB: 1.0000 | INHALATION_SPRAY | Freq: Every morning | RESPIRATORY_TRACT | 1 refills | Status: AC

## 2022-12-17 MED ORDER — OMEPRAZOLE 40 MG PO CPDR: 40.0000 mg | DELAYED_RELEASE_CAPSULE | Freq: Every morning | ORAL | 1 refills | Status: DC

## 2022-12-17 MED ORDER — ALBUTEROL SULFATE HFA 108 (90 BASE) MCG/ACT IN AERS: 2.0000 | INHALATION_SPRAY | Freq: Four times a day (QID) | RESPIRATORY_TRACT | 1 refills | Status: DC | PRN

## 2022-12-17 MED ORDER — LEVOCETIRIZINE DIHYDROCHLORIDE 5 MG PO TABS: 5.0000 mg | ORAL_TABLET | Freq: Every day | ORAL | 1 refills | Status: AC | PRN

## 2022-12-17 MED ORDER — FLUTICASONE PROPIONATE 50 MCG/ACT NA SUSP: NASAL | 1 refills | Status: DC

## 2022-12-17 NOTE — Progress Notes (Unsigned)
[facility] [place] [facility] [place] [facility] [place] -    Follow-up Note  Referring Provider: Margaree Mackintosh, MD Primary Provider: Margaree Mackintosh, MD Date of Office Visit: 12/17/2022  Subjective:   Brittany Bender (DOB: 11-23-1950) is a 72 y.o. female who [facility] in re-evaluation of the following:  HPI: Brittany Bender returns to this clinic in reevaluation of atopic respiratory disease and reflux induced respiratory disease.  I last saw her in this clinic 08 October 2022.  She continues to do very well without any cough and without any throat issues that without any other significant respiratory tract symptoms and without the need for a short acting bronchodilator while she continues on nasal fluticasone, inhaled fluticasone, omeprazole, famotidine.  Allergies as of 12/17/2022       Reactions   Codeine Other (See Comments)   Headache        Medication List    albuterol 108 (90 Base) MCG/ACT inhaler Commonly known as: VENTOLIN HFA Inhale 2 puffs into the lungs every 6 (six) hours as needed for wheezing or shortness of breath.   ALPRAZolam 0.5 MG tablet Commonly known as: XANAX TAKE ONE TABLET BY MOUTH AT BEDTIME AS NEEDED   Arnuity Ellipta 100 MCG/ACT Aepb Generic drug: Fluticasone Furoate Inhale 1 Inhalation into the lungs every morning.   celecoxib 200 MG capsule Commonly known as: CELEBREX Take 1 capsule (200 mg total) by mouth daily.   famotidine 40 MG tablet Commonly known as: PEPCID Take 1 tablet (40 mg total) by mouth at bedtime.   fluticasone 50 MCG/ACT nasal spray Commonly known as: FLONASE Place 2 sprays into both nostrils daily.   folic acid 1 MG tablet Commonly known as: FOLVITE Take 1 tablet (1 mg total) by mouth daily.   levocetirizine 5 MG tablet Commonly known as: XYZAL Take 1 tablet (5 mg total) by mouth daily as needed (Can take an extra dose during flare ups.).   Linzess 290 MCG Caps  capsule Generic drug: linaclotide Take 1 capsule by mouth once a day before breakfast   metoprolol tartrate 25 MG tablet Commonly known as: LOPRESSOR Take 1 tablet (25 mg total) by mouth 2 (two) times daily.   multivitamins ther. w/minerals Tabs tablet Take 2 tablets by mouth daily.   omeprazole 40 MG capsule Commonly known as: PRILOSEC Take 1 capsule (40 mg total) by mouth every morning.   Ozempic (0.25 or 0.5 MG/DOSE) 2 MG/3ML Sopn Generic drug: Semaglutide(0.25 or 0.5MG /DOS) Inject 1 Units into the skin once a week.   rosuvastatin 5 MG tablet Commonly known as: CRESTOR TAKE 1 TABLET BY MOUTH 3 TIMES A WEEK   Vitamin D (Ergocalciferol) 1.25 MG (50000 UNIT) Caps capsule Commonly known as: DRISDOL Take 1 capsule (50,000 Units total) by mouth once a week.    Past Medical History:  Diagnosis Date   Allergy    Anemia    Anxiety    xanax used primarily for sleep    Arthritis    all over, cervical spine- stenosis    Asthma    last SOB was back in the summer, Never had "attack"   Cancer [facility])    right breast cancer 2000-had radiation   GERD (gastroesophageal reflux disease)    Hypertension    prompted by having palpitations to see Dr,. Mendel Ryder, he only needs to follow up with her on PRN basis   Personal history of radiation therapy 2000   PONV (postoperative nausea  and vomiting)    Sleep apnea    has OSA- uses CPAP but not for last 2 weeks, due to sinus issues     Past Surgical History:  Procedure Laterality Date   ABDOMINAL HYSTERECTOMY     2004   ANTERIOR CERVICAL DECOMP/DISCECTOMY FUSION N/A 12/27/2013   Procedure: CERVICAL FOUR TO FIVE, CERVICAL FIVE TO SIX ANTERIOR CERVICAL DECOMPRESSION/DISCECTOMY FUSION 2 LEVELS;  Surgeon: Reinaldo Meeker, MD;  Location: [facility];  Service: Neurosurgery;  Laterality: N/A;   BACK SURGERY     BREAST BIOPSY Left 09/07/2013   x2   BREAST BIOPSY Left 12/13/2021   MM LT BREAST BX W LOC DEV 1ST LESION IMAGE BX SPEC STEREO  GUIDE 12/13/2021 [facility]   BREAST BIOPSY Left 12/13/2021   MM LT BREAST BX W LOC DEV EA AD LESION IMG BX SPEC STEREO GUIDE 12/13/2021 [facility]   BREAST LUMPECTOMY Right 2000   BREAST SURGERY Right 2000   right lumpectomy 2000   COLONOSCOPY     FOOT GANGLION EXCISION     right and left at different times   FOOT SURGERY     3 yrs ago broke left foot had hardware inserted and then had to have it removed   KIDNEY SURGERY     whole repaired in kidney unknown which one at age 76   KNEE ARTHROSCOPY Left 2013   LUMBAR LAMINECTOMY/DECOMPRESSION MICRODISCECTOMY  12/24/2010   Procedure: LUMBAR LAMINECTOMY/DECOMPRESSION MICRODISCECTOMY;  Surgeon: Clydene Fake;  Location: [facility];  Service: Neurosurgery;  Laterality: Right;  RIGHT LUMBAR FOUR-FIVE LAMINECTOMY/DISCECTOMY    Review of systems negative except as noted in HPI / PMHx or noted below:  Review of Systems  Constitutional: Negative.   HENT: Negative.    Eyes: Negative.   Respiratory: Negative.    Cardiovascular: Negative.   Gastrointestinal: Negative.   Genitourinary: Negative.   Musculoskeletal: Negative.   Skin: Negative.   Neurological: Negative.   Endo/Heme/Allergies: Negative.   Psychiatric/Behavioral: Negative.       Objective:   Vitals:   12/17/22 1344  BP: 132/80  Pulse: 82  Resp: 18  Temp: 98.2 F (36.8 C)  SpO2: 97%          Physical Exam Constitutional:      Appearance: She is not diaphoretic.  HENT:     Head: Normocephalic.     Right Ear: Tympanic membrane, ear canal and external ear normal.     Left Ear: Tympanic membrane, ear canal and external ear normal.     Nose: Nose normal. No mucosal edema or rhinorrhea.     Mouth/Throat:     Pharynx: Uvula midline. No oropharyngeal exudate.  Eyes:     Conjunctiva/sclera: Conjunctivae normal.  Neck:     Thyroid: No thyromegaly.     Trachea: Trachea normal. No tracheal tenderness or tracheal deviation.  Cardiovascular:     Rate  and Rhythm: Normal rate and regular rhythm.     Heart sounds: Normal heart sounds, S1 normal and S2 normal. No murmur heard. Pulmonary:     Effort: No respiratory distress.     Breath sounds: Normal breath sounds. No stridor. No wheezing or rales.  Lymphadenopathy:     Head:     Right side of head: No tonsillar adenopathy.     Left side of head: No tonsillar adenopathy.     Cervical: No cervical adenopathy.  Skin:    Findings: No erythema or rash.     Nails: There is  no clubbing.  Neurological:     Mental Status: She is alert.     Diagnostics: Spirometry was performed and demonstrated an FEV1 of 2.10 at 111 % of predicted.  Assessment and Plan:   1. Asthma, well controlled, mild persistent   2. Perennial allergic rhinitis   3. LPRD (laryngopharyngeal reflux disease)    1. Allergen avoidance measures - dust mite  2. Treat and prevent airway inflammation:   A. Fluticasone - 2 sprays each nostril 3-7 times per week  B. Arnuity 100 - 1 inhalation 3-7 times per week  3. Treat and prevent reflux induced airway inflammation:   A. Omeprazole 40 mg - 1 tablet 1 in AM  4. If needed:   A. Albuterol - 2 inhalations every 4-6 hours  B. OTC antihistamine  C. Famotidine 40mg  - 1 tablet in PM  5. Return to clinic in 6 months or earlier if problem. Taper medication???  Brittany Bender is really doing very well and I think there is an opportunity to consolidate some of her medical treatment and we will have her use nasal and inhaled fluticasone at just 3 times per week and we can see if she does just as well with her LPR while only using a proton pump inhibitor and reserving the use of famotidine as needed.  If she does well with this plan I will see her back in this clinic in 6 months or earlier if there is a problem.  Brittany Schimke, MD Allergy / Immunology [facility]

## 2022-12-17 NOTE — Patient Instructions (Addendum)
  1. Allergen avoidance measures - dust mite  2. Treat and prevent airway inflammation:   A. Fluticasone - 2 sprays each nostril 3-7 times per week  B. Arnuity 100 - 1 inhalation 3-7 times per week  3. Treat and prevent reflux induced airway inflammation:   A. Omeprazole 40 mg - 1 tablet 1 in AM  4. If needed:   A. Albuterol - 2 inhalations every 4-6 hours  B. OTC antihistamine  C. Famotidine 40mg  - 1 tablet in PM  5. Return to clinic in 6 months or earlier if problem. Taper medications???

## 2022-12-18 ENCOUNTER — Encounter: Payer: Self-pay | Admitting: Allergy and Immunology
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
## 2022-12-23 ENCOUNTER — Telehealth: Payer: Self-pay

## 2022-12-23 ENCOUNTER — Other Ambulatory Visit (HOSPITAL_COMMUNITY): Payer: Self-pay

## 2022-12-23 NOTE — Telephone Encounter (Signed)
Pharmacy Patient Advocate Encounter   Received notification from CoverMyMeds that prior authorization for Levocetirizine Dihydrochloride 5MG  tablets is required/requested.   Insurance verification completed.   The patient is insured through [employer] .   Per test claim: PA required; PA submitted to above mentioned insurance via CoverMyMeds Key/confirmation #/EOC [id number] Status is pending

## 2022-12-27 NOTE — Telephone Encounter (Signed)
Pharmacy Patient Advocate Encounter  Received notification from [facility] that Prior Authorization for Levocetirizine Dihydrochloride 5MG  tablets has been APPROVED from 12-23-2022 to 01-27-2024   PA #/Case ID/Reference #: [id number]

## 2022-12-30 ENCOUNTER — Other Ambulatory Visit: Payer: Self-pay | Admitting: Family Medicine

## 2022-12-30 ENCOUNTER — Other Ambulatory Visit: Payer: Medicare PPO

## 2022-12-30 DIAGNOSIS — M4302 Spondylolysis, cervical region: Secondary | ICD-10-CM | POA: Diagnosis not present

## 2022-12-30 DIAGNOSIS — K5903 Drug induced constipation: Secondary | ICD-10-CM | POA: Diagnosis not present

## 2022-12-30 DIAGNOSIS — G4733 Obstructive sleep apnea (adult) (pediatric): Secondary | ICD-10-CM | POA: Diagnosis not present

## 2022-12-30 DIAGNOSIS — E7800 Pure hypercholesterolemia, unspecified: Secondary | ICD-10-CM | POA: Diagnosis not present

## 2022-12-30 DIAGNOSIS — Z0000 Encounter for general adult medical examination without abnormal findings: Secondary | ICD-10-CM | POA: Diagnosis not present

## 2022-12-30 DIAGNOSIS — R7302 Impaired glucose tolerance (oral): Secondary | ICD-10-CM

## 2022-12-30 DIAGNOSIS — E785 Hyperlipidemia, unspecified: Secondary | ICD-10-CM | POA: Diagnosis not present

## 2022-12-30 DIAGNOSIS — R5383 Other fatigue: Secondary | ICD-10-CM | POA: Diagnosis not present

## 2022-12-30 DIAGNOSIS — J4520 Mild intermittent asthma, uncomplicated: Secondary | ICD-10-CM

## 2022-12-30 DIAGNOSIS — Z6841 Body Mass Index (BMI) 40.0 and over, adult: Secondary | ICD-10-CM | POA: Diagnosis not present

## 2022-12-30 DIAGNOSIS — R7303 Prediabetes: Secondary | ICD-10-CM | POA: Diagnosis not present

## 2022-12-30 DIAGNOSIS — Z8249 Family history of ischemic heart disease and other diseases of the circulatory system: Secondary | ICD-10-CM | POA: Diagnosis not present

## 2023-01-03 ENCOUNTER — Ambulatory Visit (INDEPENDENT_AMBULATORY_CARE_PROVIDER_SITE_OTHER): Payer: Medicare PPO | Admitting: Internal Medicine

## 2023-01-03 ENCOUNTER — Encounter: Payer: Self-pay | Admitting: Internal Medicine

## 2023-01-03 VITALS — BP 130/80 | HR 77 | Ht 65.0 in | Wt 243.0 lb

## 2023-01-03 DIAGNOSIS — Z23 Encounter for immunization: Secondary | ICD-10-CM | POA: Diagnosis not present

## 2023-01-03 DIAGNOSIS — Z9079 Acquired absence of other genital organ(s): Secondary | ICD-10-CM

## 2023-01-03 DIAGNOSIS — R5383 Other fatigue: Secondary | ICD-10-CM | POA: Diagnosis not present

## 2023-01-03 DIAGNOSIS — D520 Dietary folate deficiency anemia: Secondary | ICD-10-CM

## 2023-01-03 DIAGNOSIS — Z853 Personal history of malignant neoplasm of breast: Secondary | ICD-10-CM

## 2023-01-03 DIAGNOSIS — E7800 Pure hypercholesterolemia, unspecified: Secondary | ICD-10-CM

## 2023-01-03 DIAGNOSIS — Z67A1 Duffy null: Secondary | ICD-10-CM | POA: Diagnosis not present

## 2023-01-03 DIAGNOSIS — G4733 Obstructive sleep apnea (adult) (pediatric): Secondary | ICD-10-CM | POA: Diagnosis not present

## 2023-01-03 DIAGNOSIS — R7302 Impaired glucose tolerance (oral): Secondary | ICD-10-CM

## 2023-01-03 DIAGNOSIS — J302 Other seasonal allergic rhinitis: Secondary | ICD-10-CM

## 2023-01-03 DIAGNOSIS — K5909 Other constipation: Secondary | ICD-10-CM

## 2023-01-03 DIAGNOSIS — Z6841 Body Mass Index (BMI) 40.0 and over, adult: Secondary | ICD-10-CM | POA: Diagnosis not present

## 2023-01-03 DIAGNOSIS — J4520 Mild intermittent asthma, uncomplicated: Secondary | ICD-10-CM | POA: Diagnosis not present

## 2023-01-03 DIAGNOSIS — Z8659 Personal history of other mental and behavioral disorders: Secondary | ICD-10-CM

## 2023-01-03 NOTE — Patient Instructions (Addendum)
Check for B12, folate, and iron deficiency.  Has hx of benign ethnic neutropenia and folate deficiency.Has seen Hematologist previously. Continue current meds. Will not get Covid vaccine this season as suggested by Allergist. Flu vaccine given today. Continue diet, exercise and weight loss efforts. RTC in 6 months for Medicare wellness and health maintenance exam.Continue statin medication. Watch diet. CT coronary calcium scoring has been ordered.

## 2023-01-04 LAB — CBC WITH DIFFERENTIAL/PLATELET
Absolute Lymphocytes: 1402 {cells}/uL (ref 850–3900)
Absolute Monocytes: 250 {cells}/uL (ref 200–950)
Basophils Absolute: 29 {cells}/uL (ref 0–200)
Basophils Relative: 0.9 %
Eosinophils Absolute: 109 {cells}/uL (ref 15–500)
Eosinophils Relative: 3.4 %
HCT: 37.6 % (ref 35.0–45.0)
Hemoglobin: 12.1 g/dL (ref 11.7–15.5)
MCH: 30 pg (ref 27.0–33.0)
MCHC: 32.2 g/dL (ref 32.0–36.0)
MCV: 93.3 fL (ref 80.0–100.0)
MPV: 10.3 fL (ref 7.5–12.5)
Monocytes Relative: 7.8 %
Neutro Abs: 1411 {cells}/uL — ABNORMAL LOW (ref 1500–7800)
Neutrophils Relative %: 44.1 %
Platelets: 263 10*3/uL (ref 140–400)
RBC: 4.03 10*6/uL (ref 3.80–5.10)
RDW: 14.3 % (ref 11.0–15.0)
Total Lymphocyte: 43.8 %
WBC: 3.2 10*3/uL — ABNORMAL LOW (ref 3.8–10.8)

## 2023-01-04 LAB — COMPLETE METABOLIC PANEL WITH GFR
AG Ratio: 1.3 (calc) (ref 1.0–2.5)
ALT: 10 U/L (ref 6–29)
AST: 18 U/L (ref 10–35)
Albumin: 4 g/dL (ref 3.6–5.1)
Alkaline phosphatase (APISO): 67 U/L (ref 37–153)
BUN: 14 mg/dL (ref 7–25)
CO2: 28 mmol/L (ref 20–32)
Calcium: 9.3 mg/dL (ref 8.6–10.4)
Chloride: 105 mmol/L (ref 98–110)
Creat: 0.76 mg/dL (ref 0.60–1.00)
Globulin: 3.2 g/dL (ref 1.9–3.7)
Glucose, Bld: 93 mg/dL (ref 65–99)
Potassium: 4 mmol/L (ref 3.5–5.3)
Sodium: 139 mmol/L (ref 135–146)
Total Bilirubin: 0.4 mg/dL (ref 0.2–1.2)
Total Protein: 7.2 g/dL (ref 6.1–8.1)
eGFR: 84 mL/min/{1.73_m2} (ref >=60)

## 2023-01-04 LAB — TEST AUTHORIZATION

## 2023-01-04 LAB — HEMOGLOBIN A1C
Hgb A1c MFr Bld: 5.7 %{Hb} — ABNORMAL HIGH (ref <5.7)
Mean Plasma Glucose: 117 mg/dL
eAG (mmol/L): 6.5 mmol/L

## 2023-01-04 LAB — IRON,TIBC AND FERRITIN PANEL
%SAT: 23 % (ref 16–45)
Ferritin: 147 ng/mL (ref 16–288)
Iron: 62 ug/dL (ref 45–160)
TIBC: 275 ug/dL (ref 250–450)

## 2023-01-04 LAB — LIPID PANEL
Cholesterol: 223 mg/dL — ABNORMAL HIGH (ref <200)
HDL: 85 mg/dL (ref >=50)
LDL Cholesterol (Calc): 117 mg/dL — ABNORMAL HIGH
Non-HDL Cholesterol (Calc): 138 mg/dL — ABNORMAL HIGH (ref <130)
Total CHOL/HDL Ratio: 2.6 (calc) (ref <5.0)
Triglycerides: 99 mg/dL (ref <150)

## 2023-01-04 LAB — TSH: TSH: 2.04 m[IU]/L (ref 0.40–4.50)

## 2023-01-04 LAB — VITAMIN B12: Vitamin B-12: 620 pg/mL (ref 200–1100)

## 2023-01-04 LAB — FOLATE: Folate: 18.6 ng/mL

## 2023-01-20 ENCOUNTER — Ambulatory Visit
Admission: RE | Admit: 2023-01-20 | Discharge: 2023-01-20 | Disposition: A | Discharge status: Home / Self Care | Payer: No Typology Code available for payment source | Source: Ambulatory Visit | Attending: Family Medicine | Admitting: Family Medicine

## 2023-01-20 DIAGNOSIS — E785 Hyperlipidemia, unspecified: Secondary | ICD-10-CM

## 2023-01-28 ENCOUNTER — Other Ambulatory Visit: Payer: Self-pay | Admitting: Internal Medicine

## 2023-02-10 DIAGNOSIS — R7303 Prediabetes: Secondary | ICD-10-CM | POA: Diagnosis not present

## 2023-02-10 DIAGNOSIS — I2510 Atherosclerotic heart disease of native coronary artery without angina pectoris: Secondary | ICD-10-CM | POA: Diagnosis not present

## 2023-02-10 DIAGNOSIS — Z8249 Family history of ischemic heart disease and other diseases of the circulatory system: Secondary | ICD-10-CM | POA: Diagnosis not present

## 2023-02-10 DIAGNOSIS — I700 Atherosclerosis of aorta: Secondary | ICD-10-CM | POA: Diagnosis not present

## 2023-02-10 DIAGNOSIS — K5903 Drug induced constipation: Secondary | ICD-10-CM | POA: Diagnosis not present

## 2023-02-10 DIAGNOSIS — G4733 Obstructive sleep apnea (adult) (pediatric): Secondary | ICD-10-CM | POA: Diagnosis not present

## 2023-02-10 DIAGNOSIS — E785 Hyperlipidemia, unspecified: Secondary | ICD-10-CM | POA: Diagnosis not present

## 2023-02-10 DIAGNOSIS — Z6841 Body Mass Index (BMI) 40.0 and over, adult: Secondary | ICD-10-CM | POA: Diagnosis not present

## 2023-04-08 DIAGNOSIS — Z6841 Body Mass Index (BMI) 40.0 and over, adult: Secondary | ICD-10-CM | POA: Diagnosis not present

## 2023-04-08 DIAGNOSIS — Z8249 Family history of ischemic heart disease and other diseases of the circulatory system: Secondary | ICD-10-CM | POA: Diagnosis not present

## 2023-04-08 DIAGNOSIS — K5903 Drug induced constipation: Secondary | ICD-10-CM | POA: Diagnosis not present

## 2023-04-08 DIAGNOSIS — E785 Hyperlipidemia, unspecified: Secondary | ICD-10-CM | POA: Diagnosis not present

## 2023-04-08 DIAGNOSIS — R7303 Prediabetes: Secondary | ICD-10-CM | POA: Diagnosis not present

## 2023-04-08 DIAGNOSIS — I700 Atherosclerosis of aorta: Secondary | ICD-10-CM | POA: Diagnosis not present

## 2023-04-08 DIAGNOSIS — I2510 Atherosclerotic heart disease of native coronary artery without angina pectoris: Secondary | ICD-10-CM | POA: Diagnosis not present

## 2023-04-08 DIAGNOSIS — G4733 Obstructive sleep apnea (adult) (pediatric): Secondary | ICD-10-CM | POA: Diagnosis not present

## 2023-04-17 ENCOUNTER — Other Ambulatory Visit: Payer: Self-pay | Admitting: Internal Medicine

## 2023-04-24 ENCOUNTER — Ambulatory Visit: Payer: Medicare PPO | Attending: Cardiology | Admitting: Cardiology

## 2023-04-24 ENCOUNTER — Encounter: Payer: Self-pay | Admitting: Cardiology

## 2023-04-24 VITALS — BP 138/70 | HR 67 | Ht 66.0 in | Wt 261.0 lb

## 2023-04-24 DIAGNOSIS — E782 Mixed hyperlipidemia: Secondary | ICD-10-CM

## 2023-04-24 DIAGNOSIS — Z7689 Persons encountering health services in other specified circumstances: Secondary | ICD-10-CM

## 2023-04-24 DIAGNOSIS — Z79899 Other long term (current) drug therapy: Secondary | ICD-10-CM

## 2023-04-24 DIAGNOSIS — I2510 Atherosclerotic heart disease of native coronary artery without angina pectoris: Secondary | ICD-10-CM

## 2023-04-24 DIAGNOSIS — R7303 Prediabetes: Secondary | ICD-10-CM

## 2023-04-24 NOTE — Progress Notes (Signed)
 Cardiology Office Note:    Date:  04/27/2023   ID:  Brittany Bender, DOB 03/02/1950, MRN [id number]  PCP:  Margaree Mackintosh, MD  Cardiologist:  None  Electrophysiologist:  None   Referring MD: Margaree Mackintosh, MD      History of Present Illness:    Brittany Bender is a 73 y.o. female with a hx of prediabetes, asthma, hypertension, GERD, breast cancer status post radiation, coronary calcification and intermittent tachycardia.[name]   Marland KitchenDiscussed the use of AI scribe software for clinical note transcription with the patient, who gave verbal consent to proceed.   The patient, with a history of tachycardia and coronary artery calcifications, is currently on metoprolol twice a day and rosuvastatin daily. The patient reports no chest pain or shortness of breath. The patient's LDL cholesterol is 117 and  has been taking rosuvastatin daily for about two months and has noticed an increase in joint pain since the increase in dosage. The patient's hemoglobin A1c is slightly elevated at 5.7, and the patient is currently on [facility] 2 for this. The patient reports no noticeable difference in appetite or feeling with Wegovy, but did notice a difference when previously on Ozempic. The patient also takes ashwagandha, seaweed, and root supplements.       Past Medical History:  Diagnosis Date   Allergy    Anemia    Anxiety    xanax used primarily for sleep    Arthritis    all over, cervical spine- stenosis    Asthma    last SOB was back in the summer, Never had "attack"   Cancer [facility])    right breast cancer 2000-had radiation   GERD (gastroesophageal reflux disease)    Hypertension    prompted by having palpitations to see Dr,. Mendel Ryder, he only needs to follow up with her on PRN basis   Personal history of radiation therapy 2000   PONV (postoperative nausea and vomiting)    Sleep apnea    has OSA- uses CPAP but not for last 2 weeks, due to sinus issues     Past Surgical History:  Procedure Laterality  Date   ABDOMINAL HYSTERECTOMY     2004   ANTERIOR CERVICAL DECOMP/DISCECTOMY FUSION N/A 12/27/2013   Procedure: CERVICAL FOUR TO FIVE, CERVICAL FIVE TO SIX ANTERIOR CERVICAL DECOMPRESSION/DISCECTOMY FUSION 2 LEVELS;  Surgeon: Reinaldo Meeker, MD;  Location: [facility];  Service: Neurosurgery;  Laterality: N/A;   BACK SURGERY     BREAST BIOPSY Left 09/07/2013   x2   BREAST BIOPSY Left 12/13/2021   MM LT BREAST BX W LOC DEV 1ST LESION IMAGE BX SPEC STEREO GUIDE 12/13/2021 [facility]   BREAST BIOPSY Left 12/13/2021   MM LT BREAST BX W LOC DEV EA AD LESION IMG BX SPEC STEREO GUIDE 12/13/2021 [facility]   BREAST LUMPECTOMY Right 2000   BREAST SURGERY Right 2000   right lumpectomy 2000   COLONOSCOPY     FOOT GANGLION EXCISION     right and left at different times   FOOT SURGERY     3 yrs ago broke left foot had hardware inserted and then had to have it removed   KIDNEY SURGERY     whole repaired in kidney unknown which one at age 73   KNEE ARTHROSCOPY Left 2013   LUMBAR LAMINECTOMY/DECOMPRESSION MICRODISCECTOMY  12/24/2010   Procedure: LUMBAR LAMINECTOMY/DECOMPRESSION MICRODISCECTOMY;  Surgeon: Clydene Fake;  Location: [facility];  Service: Neurosurgery;  Laterality: Right;  RIGHT LUMBAR FOUR-FIVE LAMINECTOMY/DISCECTOMY    Current Medications: Current Meds  Medication Sig   albuterol (VENTOLIN HFA) 108 (90 Base) MCG/ACT inhaler Inhale 2 puffs into the lungs every 6 (six) hours as needed for wheezing or shortness of breath.   ALPRAZolam (XANAX) 0.5 MG tablet TAKE 1 TABLET AT BEDTIME AS NEEDED   ARNUITY ELLIPTA 100 MCG/ACT AEPB Inhale 1 Inhalation into the lungs every morning.   celecoxib (CELEBREX) 200 MG capsule Take 1 capsule (200 mg total) by mouth daily.   famotidine (PEPCID) 40 MG tablet Take 1 tablet (40 mg total) by mouth at bedtime.   fluticasone (FLONASE) 50 MCG/ACT nasal spray 2 sprays each nostril 3-7 times per week   folic acid (FOLVITE) 1 MG tablet Take  1 tablet (1 mg total) by mouth daily.   levocetirizine (XYZAL) 5 MG tablet Take 1 tablet (5 mg total) by mouth daily as needed (Can take an extra dose during flare ups.).   LINZESS 290 MCG CAPS capsule Take 1 capsule by mouth once a day before breakfast   metoprolol tartrate (LOPRESSOR) 25 MG tablet Take 1 tablet (25 mg total) by mouth 2 (two) times daily.   Multiple Vitamins-Minerals (MULTIVITAMINS THER. W/MINERALS) TABS Take 2 tablets by mouth daily.   omeprazole (PRILOSEC) 40 MG capsule Take 1 capsule (40 mg total) by mouth every morning.   rosuvastatin (CRESTOR) 5 MG tablet TAKE 1 TABLET BY MOUTH 3 TIMES A WEEK   Vitamin D, Ergocalciferol, (DRISDOL) 1.25 MG (50000 UNIT) CAPS capsule TAKE 1 CAPSULE ONE TIME WEEKLY   WEGOVY 0.5 MG/0.5ML SOAJ Inject 0.5 mg into the skin once a week.     Allergies:   Codeine   Social History   Socioeconomic History   Marital status: Divorced    Spouse name: Not on file   Number of children: Not on file   Years of education: Not on file   Highest education level: Bachelor's degree (e.g., BA, AB, BS)  Occupational History   Occupation: Magazine features editor: [employer]  Tobacco Use   Smoking status: Never    Passive exposure: Past   Smokeless tobacco: Never  Vaping Use   Vaping status: Never Used  Substance and Sexual Activity   Alcohol use: Yes    Comment: occasional   Drug use: No   Sexual activity: Not on file  Other Topics Concern   Not on file  Social History Narrative   Not on file   Social Drivers of Health   Financial Resource Strain: Low Risk  (12/30/2022)   Overall Financial Resource Strain (CARDIA)    Difficulty of Paying Living Expenses: Not hard at all  Food Insecurity: No Food Insecurity (12/30/2022)   Hunger Vital Sign    Worried About Running Out of Food in the Last Year: Never true    Ran Out of Food in the Last Year: Never true  Transportation Needs: No Transportation Needs (12/30/2022)   PRAPARE -  Administrator, Civil Service (Medical): No    Lack of Transportation (Non-Medical): No  Physical Activity: Insufficiently Active (12/30/2022)   Exercise Vital Sign    Days of Exercise per Week: 3 days    Minutes of Exercise per Session: 30 min  Stress: No Stress Concern Present (12/30/2022)   [employer] of Occupational Health - Occupational Stress Questionnaire    Feeling of Stress : Not at all  Social Connections: Moderately Integrated (12/30/2022)   Social Connection and Isolation Panel [NHANES]  Frequency of Communication with Friends and Family: More than three times a week    Frequency of Social Gatherings with Friends and Family: More than three times a week    Attends Religious Services: More than 4 times per year    Active Member of [employer] or Organizations: Yes    Attends Engineer, structural: More than 4 times per year    Marital Status: Divorced     Family History: The patient's family history includes Asthma in her father; Breast cancer in her maternal grandmother; Heart disease in her brother and sister. There is no history of Colon cancer, Colon polyps, Esophageal cancer, Rectal cancer, or Stomach cancer.  ROS:   Review of Systems  Constitution: Negative for decreased appetite, fever and weight gain.  HENT: Negative for congestion, ear discharge, hoarse voice and sore throat.   Eyes: Negative for discharge, redness, vision loss in right eye and visual halos.  Cardiovascular: Negative for chest pain, dyspnea on exertion, leg swelling, orthopnea and palpitations.  Respiratory: Negative for cough, hemoptysis, shortness of breath and snoring.   Endocrine: Negative for heat intolerance and polyphagia.  Hematologic/Lymphatic: Negative for bleeding problem. Does not bruise/bleed easily.  Skin: Negative for flushing, nail changes, rash and suspicious lesions.  Musculoskeletal: Negative for arthritis, joint pain, muscle cramps, myalgias, neck pain and  stiffness.  Gastrointestinal: Negative for abdominal pain, bowel incontinence, diarrhea and excessive appetite.  Genitourinary: Negative for decreased libido, genital sores and incomplete emptying.  Neurological: Negative for brief paralysis, focal weakness, headaches and loss of balance.  Psychiatric/Behavioral: Negative for altered mental status, depression and suicidal ideas.  Allergic/Immunologic: Negative for HIV exposure and persistent infections.    EKGs/Labs/Other Studies Reviewed:    The following studies were reviewed today:   EKG:  The ekg ordered today demonstrates sinus rhythm  Recent Labs: 12/30/2022: Hemoglobin 12.1; Platelets 263; TSH 2.04 04/24/2023: ALT 10; BUN 10; Creatinine, Ser 0.75; Magnesium 1.7; Potassium 4.1; Sodium 141  Recent Lipid Panel    Component Value Date/Time   CHOL 223 (H) 12/30/2022 0927   TRIG 99 12/30/2022 0927   HDL 85 12/30/2022 0927   CHOLHDL 2.6 12/30/2022 0927   VLDL 15 06/13/2016 1041   LDLCALC 117 (H) 12/30/2022 0927    Physical Exam:    VS:  BP 138/70   Pulse 67   Ht 5\' 6"  (1.676 m)   Wt 261 lb (118.4 kg)   SpO2 94%   BMI 42.13 kg/m     Wt Readings from Last 3 Encounters:  04/24/23 261 lb (118.4 kg)  01/03/23 243 lb (110.2 kg)  10/08/22 245 lb 8 oz (111.4 kg)     GEN: Well nourished, well developed in no acute distress HEENT: Normal NECK: No JVD; No carotid bruits LYMPHATICS: No lymphadenopathy CARDIAC: S1S2 noted,RRR, no murmurs, rubs, gallops RESPIRATORY:  Clear to auscultation without rales, wheezing or rhonchi  ABDOMEN: Soft, non-tender, non-distended, +bowel sounds, no guarding. EXTREMITIES: No edema, No cyanosis, no clubbing MUSCULOSKELETAL:  No deformity  SKIN: Warm and dry NEUROLOGIC:  Alert and oriented x 3, non-focal PSYCHIATRIC:  Normal affect, good insight  ASSESSMENT:    1. Encounter to establish care   2. Medication management   3. Mixed hyperlipidemia   4. Coronary artery calcification seen on CT  scan   5. Morbid obesity (HCC)   6. Prediabetes    PLAN:       Coronary Artery Calcification Coronary artery calcification confirmed. LDL cholesterol at 117 mg/dL, above target ( <  70). Potential statin intolerance due to joint pain. - Increase rosuvastatin to daily dosing. - Order repeat blood work to assess LDL levels. - Consider switching to atorvastatin if joint pain persists. - Consider adding ezetimibe if LDL target is not achieved with statin alone. - Monitor for statin intolerance symptoms such as joint and muscle pain. - Discuss non-statin options if statin intolerance persists, including bempedoic acid and injectables. - Order lipoprotein A test to assess genetic factors of hypercholesterolemia.  Tachycardia Tachycardia managed with metoprolol, effectively controlling symptoms.  Elevated Hemoglobin A1c Hemoglobin A1c at 5.7%, indicating prediabetes risk. Switched from [employer] to [employer] with less efficacy.   Follow-up Scheduled for a physical with primary care physician in May. - Schedule follow-up appointment with cardiologist in one year. - Ensure communication for any unexplained chest pain or shortness of breath.      Lifestule modification advised.   The patient is in agreement with the above plan. The patient left the office in stable condition.  The patient will follow up in   Medication Adjustments/Labs and Tests Ordered: Current medicines are reviewed at length with the patient today.  Concerns regarding medicines are outlined above.  Orders Placed This Encounter  Procedures   Comprehensive Metabolic Panel (CMET)   Magnesium   Lipid panel   Lipoprotein A (LPA)   EKG 12-Lead   No orders of the defined types were placed in this encounter.   Patient Instructions  Medication Instructions:  Your physician recommends that you continue on your current medications as directed. Please refer to the Current Medication list given to you today.  *If you need a  refill on your cardiac medications before your next appointment, please call your pharmacy*  Lab Work: CMET, Mag, Lipids, Lp(a) If you have labs (blood work) drawn today and your tests are completely normal, you will receive your results only by: MyChart Message (if you have MyChart) OR A paper copy in the mail If you have any lab test that is abnormal or we need to change your treatment, we will call you to review the results.   Follow-Up: At [facility], you and your health needs are our priority.  As part of our continuing mission to provide you with exceptional heart care, our providers are all part of one team.  This team includes your primary Cardiologist (physician) and Advanced Practice Providers or APPs (Physician Assistants and Nurse Practitioners) who all work together to provide you with the care you need, when you need it.  Your next appointment:   1 year(s)  Provider:   Thomasene Ripple, DO       Other Instructions:   1st Floor: - Lobby - Registration  - Pharmacy  - Lab - Cafe  2nd Floor: - PV Lab - Diagnostic Testing (echo, CT, nuclear med)  3rd Floor: - Vacant  4th Floor: - TCTS (cardiothoracic surgery) - AFib Clinic - Structural Heart Clinic - Vascular Surgery  - Vascular Ultrasound  5th Floor: - HeartCare Cardiology (general and EP) - Clinical Pharmacy for coumadin, hypertension, lipid, weight-loss medications, and med management appointments    Valet parking services will be available as well.      Adopting a Healthy Lifestyle.  Know what a healthy weight is for you (roughly BMI <25) and aim to maintain this   Aim for 7+ servings of fruits and vegetables daily   65-80+ fluid ounces of water or unsweet tea for healthy kidneys   Limit to max 1 drink of alcohol  per day; avoid smoking/tobacco   Limit animal fats in diet for cholesterol and heart health - choose grass fed whenever available   Avoid highly processed foods, and foods  high in saturated/trans fats   Aim for low stress - take time to unwind and care for your mental health   Aim for 150 min of moderate intensity exercise weekly for heart health, and weights twice weekly for bone health   Aim for 7-9 hours of sleep daily   When it comes to diets, agreement about the perfect plan isnt easy to find, even among the experts. Experts at the [facility] of [employer] developed an idea known as the Healthy Eating Plate. Just imagine a plate divided into logical, healthy portions.   The emphasis is on diet quality:   Load up on vegetables and fruits - one-half of your plate: Aim for color and variety, and remember that potatoes dont count.   Go for whole grains - one-quarter of your plate: Whole wheat, barley, wheat berries, quinoa, oats, brown rice, and foods made with them. If you want pasta, go with whole wheat pasta.   Protein power - one-quarter of your plate: Fish, chicken, beans, and nuts are all healthy, versatile protein sources. Limit red meat.   The diet, however, does go beyond the plate, offering a few other suggestions.   Use healthy plant oils, such as olive, canola, soy, corn, sunflower and peanut. Check the labels, and avoid partially hydrogenated oil, which have unhealthy trans fats.   If youre thirsty, drink water. Coffee and tea are good in moderation, but skip sugary drinks and limit milk and dairy products to one or two daily servings.   The type of carbohydrate in the diet is more important than the amount. Some sources of carbohydrates, such as vegetables, fruits, whole grains, and beans-are healthier than others.   Finally, stay active  Signed, Thomasene Ripple, DO  04/27/2023 10:02 AM    [facility]

## 2023-04-24 NOTE — Patient Instructions (Signed)
 Medication Instructions:  Your physician recommends that you continue on your current medications as directed. Please refer to the Current Medication list given to you today.  *If you need a refill on your cardiac medications before your next appointment, please call your pharmacy*  Lab Work: CMET, Mag, Lipids, Lp(a) If you have labs (blood work) drawn today and your tests are completely normal, you will receive your results only by: MyChart Message (if you have MyChart) OR A paper copy in the mail If you have any lab test that is abnormal or we need to change your treatment, we will call you to review the results.   Follow-Up: At [facility], you and your health needs are our priority.  As part of our continuing mission to provide you with exceptional heart care, our providers are all part of one team.  This team includes your primary Cardiologist (physician) and Advanced Practice Providers or APPs (Physician Assistants and Nurse Practitioners) who all work together to provide you with the care you need, when you need it.  Your next appointment:   1 year(s)  Provider:   Thomasene Ripple, DO       Other Instructions:   1st Floor: - Lobby - Registration  - Pharmacy  - Lab - Cafe  2nd Floor: - PV Lab - Diagnostic Testing (echo, CT, nuclear med)  3rd Floor: - Vacant  4th Floor: - TCTS (cardiothoracic surgery) - AFib Clinic - Structural Heart Clinic - Vascular Surgery  - Vascular Ultrasound  5th Floor: - HeartCare Cardiology (general and EP) - Clinical Pharmacy for coumadin, hypertension, lipid, weight-loss medications, and med management appointments    Valet parking services will be available as well.

## 2023-04-26 LAB — COMPREHENSIVE METABOLIC PANEL WITH GFR
ALT: 10 IU/L (ref 0–32)
AST: 18 IU/L (ref 0–40)
Albumin: 4.3 g/dL (ref 3.9–4.9)
Alkaline Phosphatase: 79 IU/L (ref 44–121)
BUN/Creatinine Ratio: 13 (ref 12–28)
BUN: 10 mg/dL (ref 8–27)
Bilirubin Total: 0.3 mg/dL (ref 0.0–1.2)
CO2: 24 mmol/L (ref 20–29)
Calcium: 9.1 mg/dL (ref 8.7–10.3)
Chloride: 102 mmol/L (ref 96–106)
Creatinine, Ser: 0.75 mg/dL (ref 0.57–1.00)
Globulin, Total: 3.2 g/dL (ref 1.5–4.5)
Glucose: 83 mg/dL (ref 70–99)
Potassium: 4.1 mmol/L (ref 3.5–5.2)
Sodium: 141 mmol/L (ref 134–144)
Total Protein: 7.5 g/dL (ref 6.0–8.5)
eGFR: 86 mL/min/{1.73_m2} (ref >59)

## 2023-04-26 LAB — LIPOPROTEIN A (LPA): Lipoprotein (a): 160.1 nmol/L — ABNORMAL HIGH (ref <75.0)

## 2023-04-26 LAB — MAGNESIUM: Magnesium: 1.7 mg/dL (ref 1.6–2.3)

## 2023-05-26 DIAGNOSIS — G4733 Obstructive sleep apnea (adult) (pediatric): Secondary | ICD-10-CM | POA: Diagnosis not present

## 2023-05-26 DIAGNOSIS — R7303 Prediabetes: Secondary | ICD-10-CM | POA: Diagnosis not present

## 2023-05-26 DIAGNOSIS — Z6841 Body Mass Index (BMI) 40.0 and over, adult: Secondary | ICD-10-CM | POA: Diagnosis not present

## 2023-05-26 DIAGNOSIS — I700 Atherosclerosis of aorta: Secondary | ICD-10-CM | POA: Diagnosis not present

## 2023-05-26 DIAGNOSIS — I2510 Atherosclerotic heart disease of native coronary artery without angina pectoris: Secondary | ICD-10-CM | POA: Diagnosis not present

## 2023-05-26 DIAGNOSIS — Z8249 Family history of ischemic heart disease and other diseases of the circulatory system: Secondary | ICD-10-CM | POA: Diagnosis not present

## 2023-05-26 DIAGNOSIS — K5903 Drug induced constipation: Secondary | ICD-10-CM | POA: Diagnosis not present

## 2023-05-26 DIAGNOSIS — E785 Hyperlipidemia, unspecified: Secondary | ICD-10-CM | POA: Diagnosis not present

## 2023-05-28 DIAGNOSIS — M25551 Pain in right hip: Secondary | ICD-10-CM | POA: Diagnosis not present

## 2023-05-28 DIAGNOSIS — M545 Low back pain, unspecified: Secondary | ICD-10-CM | POA: Diagnosis not present

## 2023-06-03 DIAGNOSIS — H2513 Age-related nuclear cataract, bilateral: Secondary | ICD-10-CM | POA: Diagnosis not present

## 2023-06-03 DIAGNOSIS — H40013 Open angle with borderline findings, low risk, bilateral: Secondary | ICD-10-CM | POA: Diagnosis not present

## 2023-06-03 DIAGNOSIS — H5213 Myopia, bilateral: Secondary | ICD-10-CM | POA: Diagnosis not present

## 2023-06-03 DIAGNOSIS — H524 Presbyopia: Secondary | ICD-10-CM | POA: Diagnosis not present

## 2023-06-03 DIAGNOSIS — H25013 Cortical age-related cataract, bilateral: Secondary | ICD-10-CM | POA: Diagnosis not present

## 2023-06-03 DIAGNOSIS — H52223 Regular astigmatism, bilateral: Secondary | ICD-10-CM | POA: Diagnosis not present

## 2023-06-04 DIAGNOSIS — M1611 Unilateral primary osteoarthritis, right hip: Secondary | ICD-10-CM | POA: Diagnosis not present

## 2023-06-04 DIAGNOSIS — M47896 Other spondylosis, lumbar region: Secondary | ICD-10-CM | POA: Diagnosis not present

## 2023-06-10 ENCOUNTER — Other Ambulatory Visit: Payer: Self-pay | Admitting: Internal Medicine

## 2023-06-19 ENCOUNTER — Other Ambulatory Visit: Payer: Medicare PPO

## 2023-06-19 DIAGNOSIS — Z0000 Encounter for general adult medical examination without abnormal findings: Secondary | ICD-10-CM

## 2023-06-19 DIAGNOSIS — R7302 Impaired glucose tolerance (oral): Secondary | ICD-10-CM

## 2023-06-19 DIAGNOSIS — Z1329 Encounter for screening for other suspected endocrine disorder: Secondary | ICD-10-CM | POA: Diagnosis not present

## 2023-06-19 DIAGNOSIS — E7800 Pure hypercholesterolemia, unspecified: Secondary | ICD-10-CM | POA: Diagnosis not present

## 2023-06-19 DIAGNOSIS — G4733 Obstructive sleep apnea (adult) (pediatric): Secondary | ICD-10-CM

## 2023-06-19 DIAGNOSIS — D520 Dietary folate deficiency anemia: Secondary | ICD-10-CM | POA: Diagnosis not present

## 2023-06-20 ENCOUNTER — Ambulatory Visit: Payer: Self-pay | Admitting: Internal Medicine

## 2023-06-20 LAB — CBC WITH DIFFERENTIAL/PLATELET
Absolute Lymphocytes: 1014 {cells}/uL (ref 850–3900)
Absolute Monocytes: 279 {cells}/uL (ref 200–950)
Basophils Absolute: 19 {cells}/uL (ref 0–200)
Basophils Relative: 0.6 %
Eosinophils Absolute: 118 {cells}/uL (ref 15–500)
Eosinophils Relative: 3.8 %
HCT: 36.2 % (ref 35.0–45.0)
Hemoglobin: 11.8 g/dL (ref 11.7–15.5)
MCH: 30.4 pg (ref 27.0–33.0)
MCHC: 32.6 g/dL (ref 32.0–36.0)
MCV: 93.3 fL (ref 80.0–100.0)
MPV: 10.2 fL (ref 7.5–12.5)
Monocytes Relative: 9 %
Neutro Abs: 1671 {cells}/uL (ref 1500–7800)
Neutrophils Relative %: 53.9 %
Platelets: 201 10*3/uL (ref 140–400)
RBC: 3.88 10*6/uL (ref 3.80–5.10)
RDW: 14.7 % (ref 11.0–15.0)
Total Lymphocyte: 32.7 %
WBC: 3.1 10*3/uL — ABNORMAL LOW (ref 3.8–10.8)

## 2023-06-20 LAB — LIPID PANEL
Cholesterol: 180 mg/dL (ref <200)
HDL: 86 mg/dL (ref >=50)
LDL Cholesterol (Calc): 78 mg/dL
Non-HDL Cholesterol (Calc): 94 mg/dL (ref <130)
Total CHOL/HDL Ratio: 2.1 (calc) (ref <5.0)
Triglycerides: 77 mg/dL (ref <150)

## 2023-06-20 LAB — COMPLETE METABOLIC PANEL WITHOUT GFR
AG Ratio: 1.2 (calc) (ref 1.0–2.5)
ALT: 7 U/L (ref 6–29)
AST: 16 U/L (ref 10–35)
Albumin: 3.9 g/dL (ref 3.6–5.1)
Alkaline phosphatase (APISO): 69 U/L (ref 37–153)
BUN: 11 mg/dL (ref 7–25)
CO2: 27 mmol/L (ref 20–32)
Calcium: 8.8 mg/dL (ref 8.6–10.4)
Chloride: 107 mmol/L (ref 98–110)
Creat: 0.67 mg/dL (ref 0.60–1.00)
Globulin: 3.3 g/dL (ref 1.9–3.7)
Glucose, Bld: 88 mg/dL (ref 65–99)
Potassium: 4 mmol/L (ref 3.5–5.3)
Sodium: 141 mmol/L (ref 135–146)
Total Bilirubin: 0.4 mg/dL (ref 0.2–1.2)
Total Protein: 7.2 g/dL (ref 6.1–8.1)

## 2023-06-20 LAB — TSH: TSH: 1.05 m[IU]/L (ref 0.40–4.50)

## 2023-06-20 LAB — HEMOGLOBIN A1C
Hgb A1c MFr Bld: 5.8 % — ABNORMAL HIGH (ref <5.7)
Mean Plasma Glucose: 120 mg/dL
eAG (mmol/L): 6.6 mmol/L
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
## 2023-06-26 ENCOUNTER — Ambulatory Visit: Payer: Medicare PPO | Admitting: Internal Medicine

## 2023-06-26 ENCOUNTER — Encounter: Payer: Self-pay | Admitting: Internal Medicine

## 2023-06-26 VITALS — BP 120/80 | HR 110 | Ht 64.0 in | Wt 258.0 lb

## 2023-06-26 DIAGNOSIS — K219 Gastro-esophageal reflux disease without esophagitis: Secondary | ICD-10-CM

## 2023-06-26 DIAGNOSIS — Z0000 Encounter for general adult medical examination without abnormal findings: Secondary | ICD-10-CM | POA: Diagnosis not present

## 2023-06-26 DIAGNOSIS — E785 Hyperlipidemia, unspecified: Secondary | ICD-10-CM | POA: Diagnosis not present

## 2023-06-26 DIAGNOSIS — E559 Vitamin D deficiency, unspecified: Secondary | ICD-10-CM

## 2023-06-26 DIAGNOSIS — Z90710 Acquired absence of both cervix and uterus: Secondary | ICD-10-CM

## 2023-06-26 DIAGNOSIS — R7302 Impaired glucose tolerance (oral): Secondary | ICD-10-CM | POA: Diagnosis not present

## 2023-06-26 DIAGNOSIS — I10 Essential (primary) hypertension: Secondary | ICD-10-CM | POA: Diagnosis not present

## 2023-06-26 DIAGNOSIS — E669 Obesity, unspecified: Secondary | ICD-10-CM | POA: Diagnosis not present

## 2023-06-26 DIAGNOSIS — J4520 Mild intermittent asthma, uncomplicated: Secondary | ICD-10-CM

## 2023-06-26 DIAGNOSIS — J302 Other seasonal allergic rhinitis: Secondary | ICD-10-CM

## 2023-06-26 DIAGNOSIS — G4733 Obstructive sleep apnea (adult) (pediatric): Secondary | ICD-10-CM

## 2023-06-26 DIAGNOSIS — Z67A1 Duffy null: Secondary | ICD-10-CM

## 2023-06-26 DIAGNOSIS — Z6841 Body Mass Index (BMI) 40.0 and over, adult: Secondary | ICD-10-CM | POA: Diagnosis not present

## 2023-06-26 DIAGNOSIS — Z853 Personal history of malignant neoplasm of breast: Secondary | ICD-10-CM

## 2023-06-26 DIAGNOSIS — F419 Anxiety disorder, unspecified: Secondary | ICD-10-CM

## 2023-06-26 DIAGNOSIS — Z9889 Other specified postprocedural states: Secondary | ICD-10-CM

## 2023-06-26 DIAGNOSIS — D709 Neutropenia, unspecified: Secondary | ICD-10-CM

## 2023-06-26 DIAGNOSIS — J45909 Unspecified asthma, uncomplicated: Secondary | ICD-10-CM | POA: Diagnosis not present

## 2023-06-26 DIAGNOSIS — E7800 Pure hypercholesterolemia, unspecified: Secondary | ICD-10-CM

## 2023-06-26 DIAGNOSIS — Z8659 Personal history of other mental and behavioral disorders: Secondary | ICD-10-CM

## 2023-06-26 LAB — POCT URINALYSIS DIP (CLINITEK)
Bilirubin, UA: NEGATIVE
Blood, UA: NEGATIVE
Glucose, UA: NEGATIVE mg/dL
Ketones, POC UA: NEGATIVE mg/dL
Leukocytes, UA: NEGATIVE
Nitrite, UA: NEGATIVE
POC PROTEIN,UA: NEGATIVE
Spec Grav, UA: 1.015 (ref 1.010–1.025)
Urobilinogen, UA: 0.2 U/dL
pH, UA: 6 (ref 5.0–8.0)

## 2023-06-27 ENCOUNTER — Encounter: Payer: Self-pay | Admitting: Internal Medicine

## 2023-06-27 NOTE — Patient Instructions (Addendum)
 It was a pleasure to see you today. Please continue current medications and follow up in 6 months. Labs are stable. Continue Ozempic. Lipid panel is normal. Continue Vitamin D  supplement. Continue BP meds. Continue statin medication.Continue metoprolol . Continue Linzess . Continue to exercise. Follow up in 6 months. Have bone density study.

## 2023-06-30 DIAGNOSIS — H40013 Open angle with borderline findings, low risk, bilateral: Secondary | ICD-10-CM | POA: Diagnosis not present

## 2023-06-30 DIAGNOSIS — H2513 Age-related nuclear cataract, bilateral: Secondary | ICD-10-CM | POA: Diagnosis not present

## 2023-06-30 DIAGNOSIS — H2511 Age-related nuclear cataract, right eye: Secondary | ICD-10-CM | POA: Diagnosis not present

## 2023-06-30 DIAGNOSIS — I10 Essential (primary) hypertension: Secondary | ICD-10-CM | POA: Diagnosis not present

## 2023-07-07 DIAGNOSIS — G4733 Obstructive sleep apnea (adult) (pediatric): Secondary | ICD-10-CM | POA: Diagnosis not present

## 2023-07-07 DIAGNOSIS — I2510 Atherosclerotic heart disease of native coronary artery without angina pectoris: Secondary | ICD-10-CM | POA: Diagnosis not present

## 2023-07-07 DIAGNOSIS — I700 Atherosclerosis of aorta: Secondary | ICD-10-CM | POA: Diagnosis not present

## 2023-07-07 DIAGNOSIS — Z8249 Family history of ischemic heart disease and other diseases of the circulatory system: Secondary | ICD-10-CM | POA: Diagnosis not present

## 2023-07-07 DIAGNOSIS — Z6841 Body Mass Index (BMI) 40.0 and over, adult: Secondary | ICD-10-CM | POA: Diagnosis not present

## 2023-07-07 DIAGNOSIS — E785 Hyperlipidemia, unspecified: Secondary | ICD-10-CM | POA: Diagnosis not present

## 2023-07-07 DIAGNOSIS — K5903 Drug induced constipation: Secondary | ICD-10-CM | POA: Diagnosis not present

## 2023-07-07 DIAGNOSIS — R7303 Prediabetes: Secondary | ICD-10-CM | POA: Diagnosis not present

## 2023-07-07 IMAGING — MG MM DIGITAL SCREENING BILAT W/ TOMO AND CAD
8 series · 8 of 24 positions shown · non-contrast
Comparison: Previous exam(s).

CLINICAL DATA: Screening.

EXAM:
DIGITAL SCREENING BILATERAL MAMMOGRAM WITH TOMOSYNTHESIS AND CAD
TECHNIQUE: Bilateral screening digital craniocaudal and mediolateral oblique
mammograms were obtained. Bilateral screening digital breast
tomosynthesis was performed. The images were evaluated with
computer-aided detection.

[L CC synth-2D]
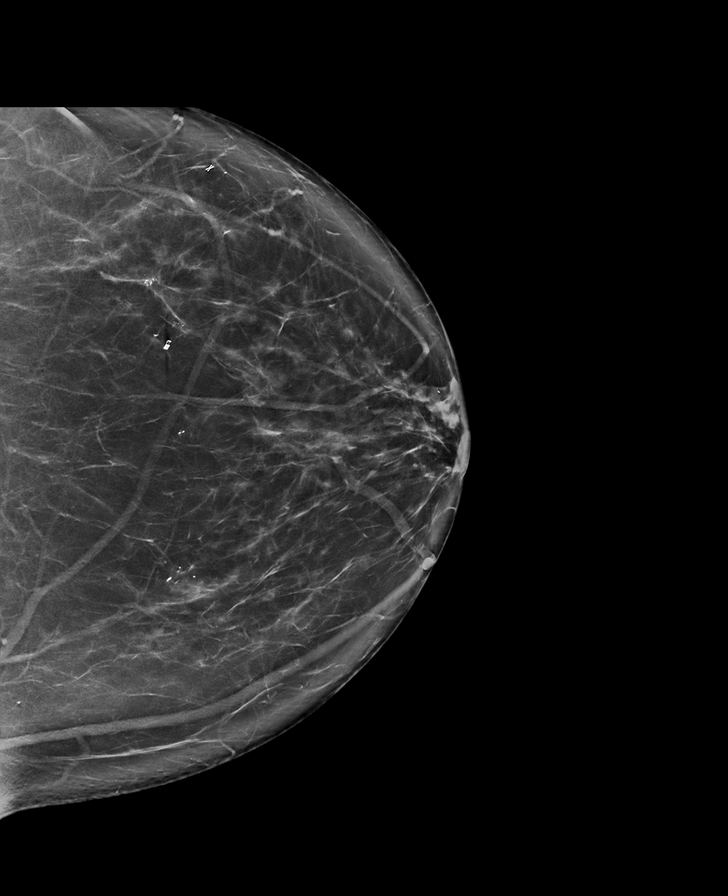

[R CC synth-2D]
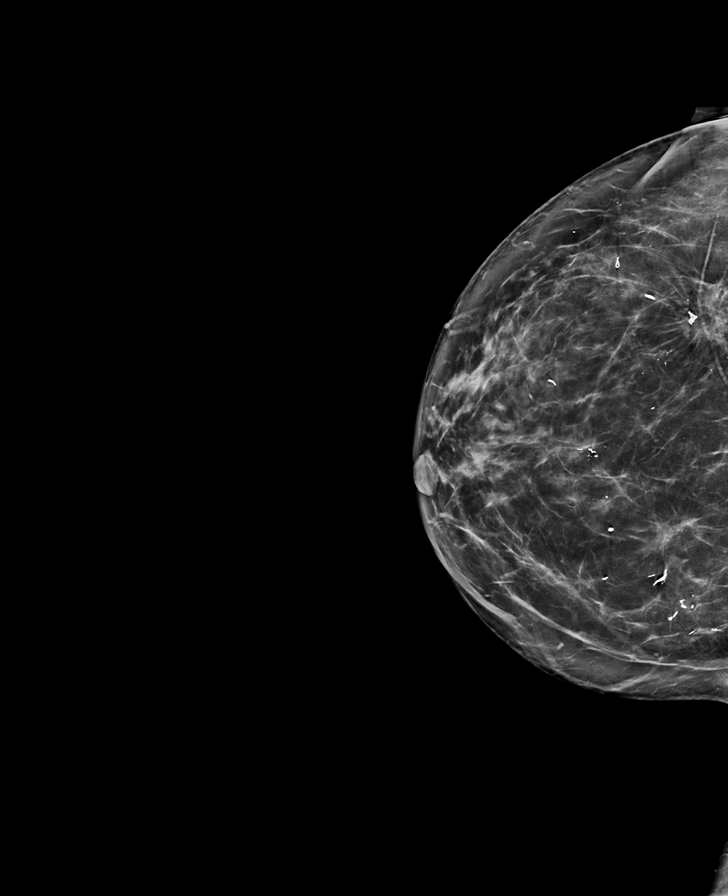

[L MLO synth-2D]
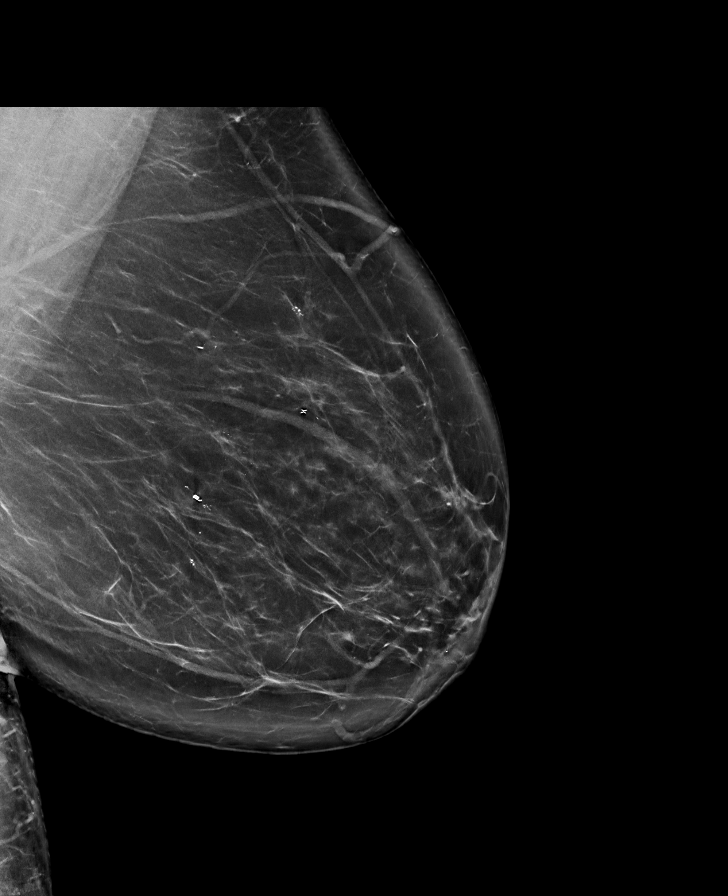

[R MLO synth-2D]
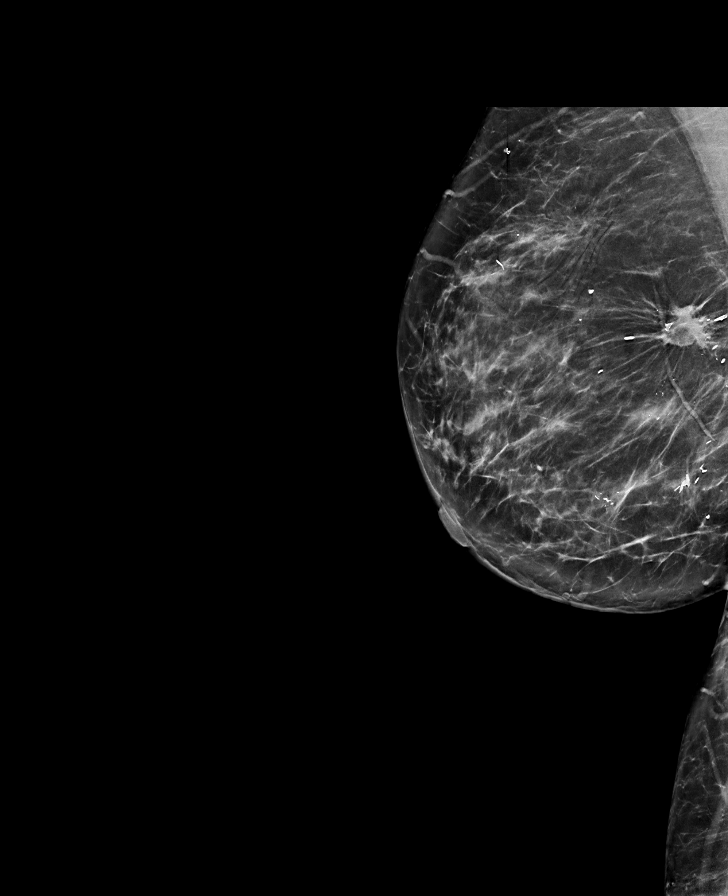

[L MLO tomo · tomo slice 51/100.0]
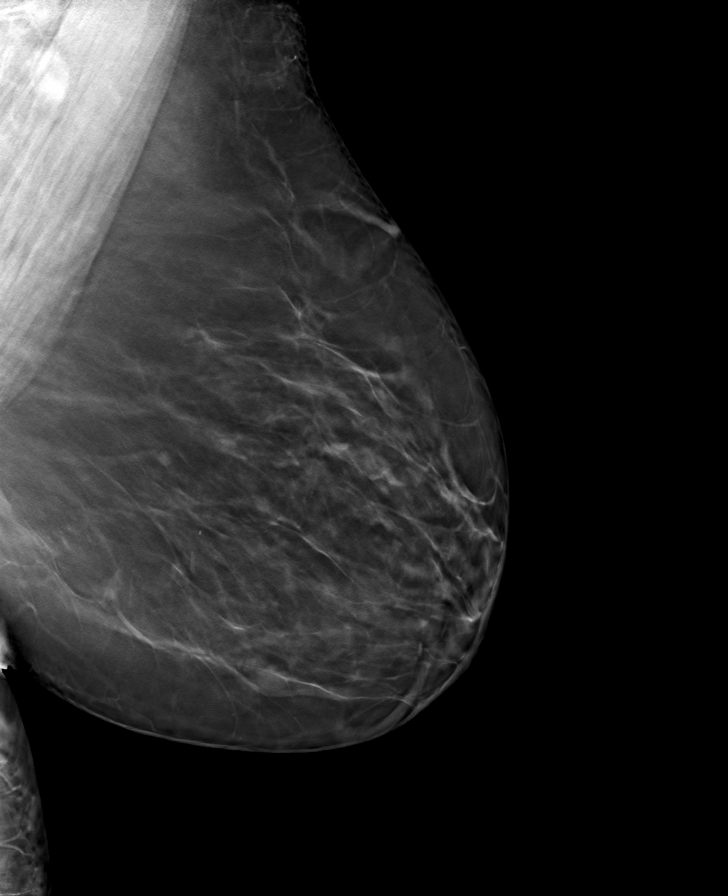

[L CC tomo · tomo slice 46/91.0]
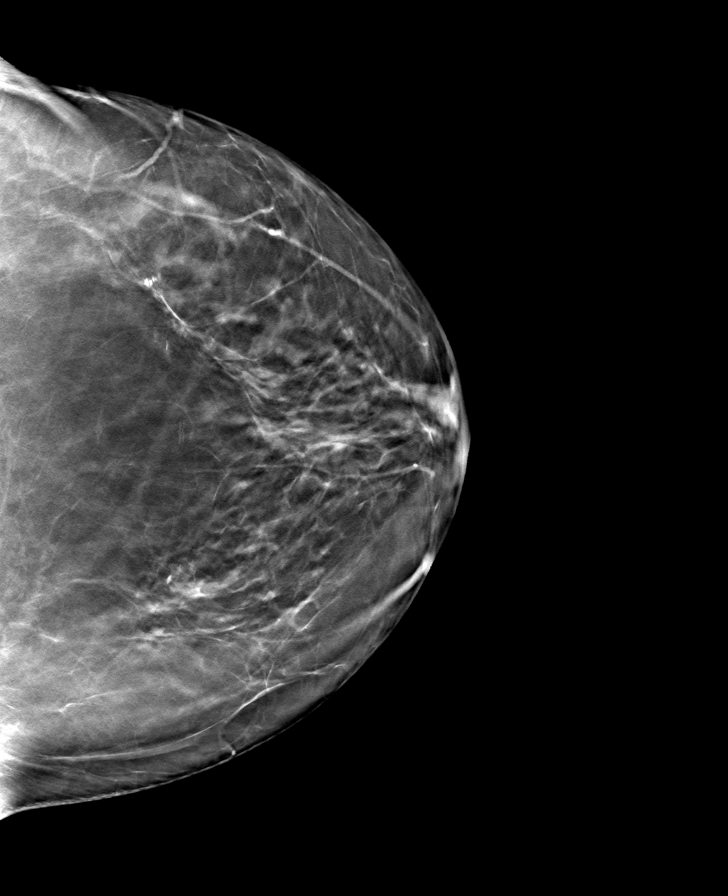

[R CC tomo · tomo slice 44/87.0]
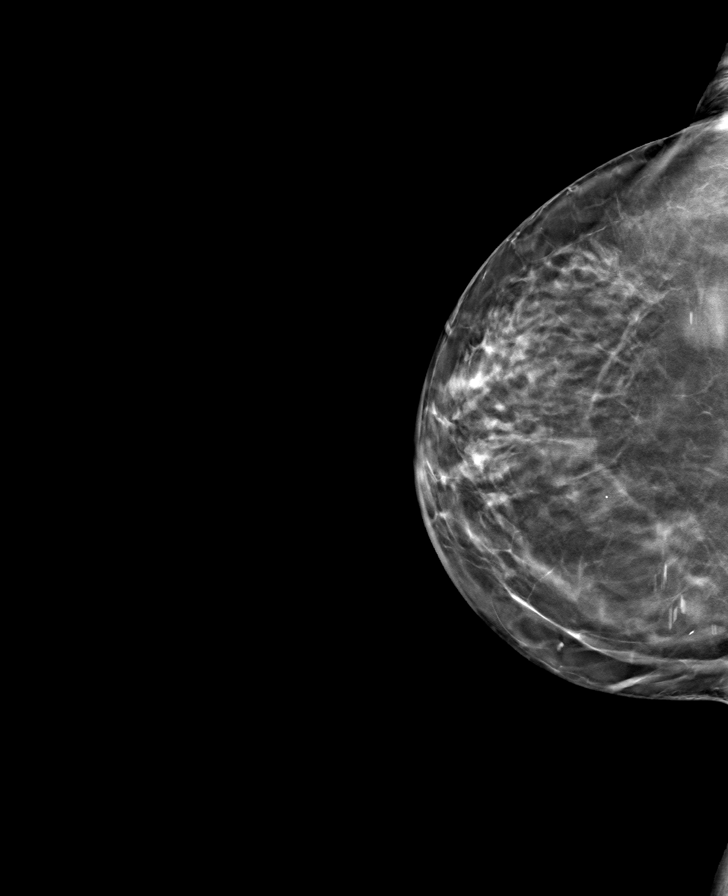

[R MLO tomo · tomo slice 45/88.0]
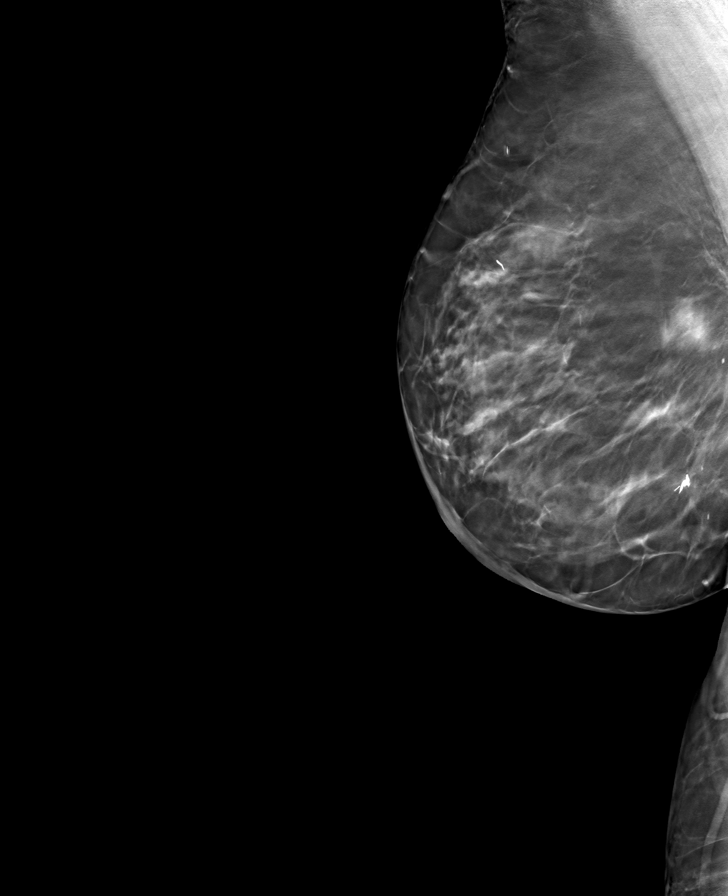

[8 of 24 positions shown; findings below may reference images not displayed]

ACR Breast Density Category b: There are scattered areas of
fibroglandular density.
FINDINGS: In the right breast, a possible asymmetry warrants further
evaluation. In the left breast, no findings suspicious for
malignancy.
IMPRESSION: Further evaluation is suggested for possible asymmetry in the right
breast.

RECOMMENDATION:
Diagnostic mammogram and possibly ultrasound of the right breast.
(Code:BU-[id number])

The patient will be contacted regarding the findings, and additional
imaging will be scheduled.

BI-RADS CATEGORY  0: Incomplete. Need additional imaging evaluation
and/or prior mammograms for comparison.

## 2023-07-25 ENCOUNTER — Other Ambulatory Visit: Payer: Self-pay

## 2023-07-25 DIAGNOSIS — K589 Irritable bowel syndrome without diarrhea: Secondary | ICD-10-CM

## 2023-07-25 MED ORDER — LINACLOTIDE 290 MCG PO CAPS: 290.0000 ug | ORAL_CAPSULE | Freq: Every day | ORAL | 0 refills | Status: DC

## 2023-07-25 NOTE — Progress Notes (Signed)
 Completed.

## 2023-08-04 ENCOUNTER — Other Ambulatory Visit: Payer: Self-pay | Admitting: Internal Medicine

## 2023-08-04 DIAGNOSIS — K589 Irritable bowel syndrome without diarrhea: Secondary | ICD-10-CM

## 2023-08-04 NOTE — Telephone Encounter (Unsigned)
 Copied from CRM [id number]. Topic: Clinical - Medication Refill >> Aug 04, 2023 11:23 AM Ivette P wrote: Medication: linaclotide  (LINZESS ) 290 MCG CAPS capsule   Has the patient contacted their pharmacy? Yes (Agent: If no, request that the patient contact the pharmacy for the refill. If patient does not wish to contact the pharmacy document the reason why and proceed with request.) (Agent: If yes, when and what did the pharmacy advise?)  This is the patient's preferred pharmacy:  [facility] - [place], [place] - [address] [place] [address] Phone: [phone] Fax: [phone]  Is this the correct pharmacy for this prescription? Yes If no, delete pharmacy and type the correct one.   Has the prescription been filled recently? Yes, 07/25/2023 - sent to [facility]  Is the patient out of the medication? No  Has the patient been seen for an appointment in the last year OR does the patient have an upcoming appointment? Yes, 01/02/2024  Can we respond through MyChart? Yes  Agent: Please be advised that Rx refills may take up to 3 business days. We ask that you follow-up with your pharmacy.

## 2023-08-06 MED ORDER — LINACLOTIDE 290 MCG PO CAPS: 290.0000 ug | ORAL_CAPSULE | Freq: Every day | ORAL | 0 refills | Status: AC

## 2023-08-07 ENCOUNTER — Other Ambulatory Visit: Payer: Self-pay | Admitting: Internal Medicine

## 2023-08-07 MED ORDER — ROSUVASTATIN CALCIUM 5 MG PO TABS
ORAL_TABLET | ORAL | 3 refills | Status: DC
Start: 2023-08-07 — End: 2023-10-06

## 2023-08-07 NOTE — Telephone Encounter (Signed)
 Copied from CRM [id number]. Topic: Clinical - Prescription Issue >> Aug 07, 2023 12:53 PM Cleave MATSU wrote: Reason for CRM: rosuvastatin  needs to be re sent in to [facility] , she stated that the pharmacy said that the prescriber cancelled the prescription but that wasn't true

## 2023-08-18 DIAGNOSIS — R7303 Prediabetes: Secondary | ICD-10-CM | POA: Diagnosis not present

## 2023-08-18 DIAGNOSIS — I2510 Atherosclerotic heart disease of native coronary artery without angina pectoris: Secondary | ICD-10-CM | POA: Diagnosis not present

## 2023-08-18 DIAGNOSIS — G4733 Obstructive sleep apnea (adult) (pediatric): Secondary | ICD-10-CM | POA: Diagnosis not present

## 2023-08-18 DIAGNOSIS — E785 Hyperlipidemia, unspecified: Secondary | ICD-10-CM | POA: Diagnosis not present

## 2023-08-18 DIAGNOSIS — Z8249 Family history of ischemic heart disease and other diseases of the circulatory system: Secondary | ICD-10-CM | POA: Diagnosis not present

## 2023-08-18 DIAGNOSIS — K5903 Drug induced constipation: Secondary | ICD-10-CM | POA: Diagnosis not present

## 2023-08-18 DIAGNOSIS — I700 Atherosclerosis of aorta: Secondary | ICD-10-CM | POA: Diagnosis not present

## 2023-08-18 DIAGNOSIS — Z6841 Body Mass Index (BMI) 40.0 and over, adult: Secondary | ICD-10-CM | POA: Diagnosis not present

## 2023-08-28 ENCOUNTER — Telehealth: Payer: Self-pay | Admitting: Internal Medicine

## 2023-09-05 DIAGNOSIS — H40013 Open angle with borderline findings, low risk, bilateral: Secondary | ICD-10-CM | POA: Diagnosis not present

## 2023-09-05 DIAGNOSIS — I10 Essential (primary) hypertension: Secondary | ICD-10-CM | POA: Diagnosis not present

## 2023-09-05 DIAGNOSIS — H2513 Age-related nuclear cataract, bilateral: Secondary | ICD-10-CM | POA: Diagnosis not present

## 2023-09-08 DIAGNOSIS — Z01419 Encounter for gynecological examination (general) (routine) without abnormal findings: Secondary | ICD-10-CM | POA: Diagnosis not present

## 2023-09-17 ENCOUNTER — Encounter: Payer: Self-pay | Admitting: Internal Medicine

## 2023-09-17 ENCOUNTER — Ambulatory Visit: Admitting: Internal Medicine

## 2023-09-17 VITALS — Ht 64.0 in | Wt 258.0 lb

## 2023-09-17 DIAGNOSIS — M25552 Pain in left hip: Secondary | ICD-10-CM | POA: Diagnosis not present

## 2023-09-17 DIAGNOSIS — M5442 Lumbago with sciatica, left side: Secondary | ICD-10-CM | POA: Diagnosis not present

## 2023-09-17 DIAGNOSIS — Z0000 Encounter for general adult medical examination without abnormal findings: Secondary | ICD-10-CM | POA: Diagnosis not present

## 2023-09-17 NOTE — Patient Instructions (Signed)
 Next appointment: Follow up in one year for your annual wellness visit    Preventive Care 65 Years and Older, Female Preventive care refers to lifestyle choices and visits with your health care provider that can promote health and wellness. What does preventive care include? A yearly physical exam. This is also called an annual well check. Dental exams once or twice a year. Routine eye exams. Ask your health care provider how often you should have your eyes checked. Personal lifestyle choices, including: Daily care of your teeth and gums. Regular physical activity. Eating a healthy diet. Avoiding tobacco and drug use. Limiting alcohol use. Practicing safe sex. Taking low-dose aspirin every day. Taking vitamin and mineral supplements as recommended by your health care provider. What happens during an annual well check? The services and screenings done by your health care provider during your annual well check will depend on your age, overall health, lifestyle risk factors, and family history of disease. Counseling  Your health care provider may ask you questions about your: Alcohol use. Tobacco use. Drug use. Emotional well-being. Home and relationship well-being. Sexual activity. Eating habits. History of falls. Memory and ability to understand (cognition). Work and work Astronomer. Reproductive health. Screening  You may have the following tests or measurements: Height, weight, and BMI. Blood pressure. Lipid and cholesterol levels. These may be checked every 5 years, or more frequently if you are over 17 years old. Skin check. Lung cancer screening. You may have this screening every year starting at age 71 if you have a 30-pack-year history of smoking and currently smoke or have quit within the past 15 years. Fecal occult blood test (FOBT) of the stool. You may have this test every year starting at age 71. Flexible sigmoidoscopy or colonoscopy. You may have a  sigmoidoscopy every 5 years or a colonoscopy every 10 years starting at age 71. Hepatitis C blood test. Hepatitis B blood test. Sexually transmitted disease (STD) testing. Diabetes screening. This is done by checking your blood sugar (glucose) after you have not eaten for a while (fasting). You may have this done every 1-3 years. Bone density scan. This is done to screen for osteoporosis. You may have this done starting at age 71. Mammogram. This may be done every 1-2 years. Talk to your health care provider about how often you should have regular mammograms. Talk with your health care provider about your test results, treatment options, and if necessary, the need for more tests. Vaccines  Your health care provider may recommend certain vaccines, such as: Influenza vaccine. This is recommended every year. Tetanus, diphtheria, and acellular pertussis (Tdap, Td) vaccine. You may need a Td booster every 10 years. Zoster vaccine. You may need this after age 71. Pneumococcal 13-valent conjugate (PCV13) vaccine. One dose is recommended after age 18. Pneumococcal polysaccharide (PPSV23) vaccine. One dose is recommended after age 25. Talk to your health care provider about which screenings and vaccines you need and how often you need them. This information is not intended to replace advice given to you by your health care provider. Make sure you discuss any questions you have with your health care provider. Document Released: 02/10/2015 Document Revised: 10/04/2015 Document Reviewed: 11/15/2014 Elsevier Interactive Patient Education  2017 [employer].  Fall Prevention in the Home Falls can cause injuries. They can happen to people of all ages. There are many things you can do to make your home safe and to help prevent falls. What can I do on the outside of  my home? Regularly fix the edges of walkways and driveways and fix any cracks. Remove anything that might make you trip as you walk through a  door, such as a raised step or threshold. Trim any bushes or trees on the path to your home. Use bright outdoor lighting. Clear any walking paths of anything that might make someone trip, such as rocks or tools. Regularly check to see if handrails are loose or broken. Make sure that both sides of any steps have handrails. Any raised decks and porches should have guardrails on the edges. Have any leaves, snow, or ice cleared regularly. Use sand or salt on walking paths during winter. Clean up any spills in your garage right away. This includes oil or grease spills. What can I do in the bathroom? Use night lights. Install grab bars by the toilet and in the tub and shower. Do not use towel bars as grab bars. Use non-skid mats or decals in the tub or shower. If you need to sit down in the shower, use a plastic, non-slip stool. Keep the floor dry. Clean up any water that spills on the floor as soon as it happens. Remove soap buildup in the tub or shower regularly. Attach bath mats securely with double-sided non-slip rug tape. Do not have throw rugs and other things on the floor that can make you trip. What can I do in the bedroom? Use night lights. Make sure that you have a light by your bed that is easy to reach. Do not use any sheets or blankets that are too big for your bed. They should not hang down onto the floor. Have a firm chair that has side arms. You can use this for support while you get dressed. Do not have throw rugs and other things on the floor that can make you trip. What can I do in the kitchen? Clean up any spills right away. Avoid walking on wet floors. Keep items that you use a lot in easy-to-reach places. If you need to reach something above you, use a strong step stool that has a grab bar. Keep electrical cords out of the way. Do not use floor polish or wax that makes floors slippery. If you must use wax, use non-skid floor wax. Do not have throw rugs and other things  on the floor that can make you trip. What can I do with my stairs? Do not leave any items on the stairs. Make sure that there are handrails on both sides of the stairs and use them. Fix handrails that are broken or loose. Make sure that handrails are as long as the stairways. Check any carpeting to make sure that it is firmly attached to the stairs. Fix any carpet that is loose or worn. Avoid having throw rugs at the top or bottom of the stairs. If you do have throw rugs, attach them to the floor with carpet tape. Make sure that you have a light switch at the top of the stairs and the bottom of the stairs. If you do not have them, ask someone to add them for you. What else can I do to help prevent falls? Wear shoes that: Do not have high heels. Have rubber bottoms. Are comfortable and fit you well. Are closed at the toe. Do not wear sandals. If you use a stepladder: Make sure that it is fully opened. Do not climb a closed stepladder. Make sure that both sides of the stepladder are locked into place. Ask  someone to hold it for you, if possible. Clearly mark and make sure that you can see: Any grab bars or handrails. First and last steps. Where the edge of each step is. Use tools that help you move around (mobility aids) if they are needed. These include: Canes. Walkers. Scooters. Crutches. Turn on the lights when you go into a dark area. Replace any light bulbs as soon as they burn out. Set up your furniture so you have a clear path. Avoid moving your furniture around. If any of your floors are uneven, fix them. If there are any pets around you, be aware of where they are. Review your medicines with your doctor. Some medicines can make you feel dizzy. This can increase your chance of falling. Ask your doctor what other things that you can do to help prevent falls. This information is not intended to replace advice given to you by your health care provider. Make sure you discuss any  questions you have with your health care provider. Document Released: 11/10/2008 Document Revised: 06/22/2015 Document Reviewed: 02/18/2014 Elsevier Interactive Patient Education  2017 [employer].

## 2023-09-17 NOTE — Progress Notes (Signed)
 Subjective:   Brittany Bender is a 73 y.o. female who presents for Medicare Annual (Subsequent) preventive examination.  Visit Complete: Virtual I connected with  Brittany Bender on 09/17/23 by a audio enabled telemedicine application and verified that I am speaking with the correct person using two identifiers.  Patient Location: Home  Provider Location: Office/Clinic  I discussed the limitations of evaluation and management by telemedicine. The patient expressed understanding and agreed to proceed.  Vital Signs: Because this visit was a virtual/telehealth visit, some criteria may be missing or patient reported. Any vitals not documented were not able to be obtained and vitals that have been documented are patient reported.  Patient Medicare AWV questionnaire was completed by the patient on 09/17/2023 ; I have confirmed that all information answered by patient is correct and no changes since this date.  Cardiac Risk Factors include: advanced age (>45men, >43 women)     Objective:    Today's Vitals   09/17/23 1032 09/17/23 1050  Weight: 258 lb (117 kg)   Height: 5' 4 (1.626 m)   PainSc: 7  7   PainLoc: Back    Body mass index is 44.29 kg/m.     09/17/2023   10:32 AM 06/20/2022    3:03 PM 06/18/2021   10:52 AM 09/21/2019   10:18 AM 11/19/2016    8:20 PM 11/04/2016    9:50 AM 10/21/2016    4:43 PM  Advanced Directives  Does Patient Have a Medical Advance Directive? No No No No No  No  No   Would patient like information on creating a medical advance directive? No - Patient declined No - Patient declined No - Patient declined No - Patient declined        Data saved with a previous flowsheet row definition    Current Medications (verified) Outpatient Encounter Medications as of 09/17/2023  Medication Sig   albuterol  (VENTOLIN  HFA) 108 (90 Base) MCG/ACT inhaler Inhale 2 puffs into the lungs every 6 (six) hours as needed for wheezing or shortness of breath.   ALPRAZolam   (XANAX ) 0.5 MG tablet TAKE 1 TABLET AT BEDTIME AS NEEDED   ARNUITY ELLIPTA  100 MCG/ACT AEPB Inhale 1 Inhalation into the lungs every morning.   celecoxib  (CELEBREX ) 200 MG capsule Take 1 capsule (200 mg total) by mouth daily.   famotidine  (PEPCID ) 40 MG tablet Take 1 tablet (40 mg total) by mouth at bedtime.   fluticasone  (FLONASE ) 50 MCG/ACT nasal spray 2 sprays each nostril 3-7 times per week   folic acid  (FOLVITE ) 1 MG tablet Take 1 tablet (1 mg total) by mouth daily.   levocetirizine (XYZAL ) 5 MG tablet Take 1 tablet (5 mg total) by mouth daily as needed (Can take an extra dose during flare ups.).   linaclotide  (LINZESS ) 290 MCG CAPS capsule Take 1 capsule (290 mcg total) by mouth daily before breakfast.   metoprolol  tartrate (LOPRESSOR ) 25 MG tablet TAKE 1 TABLET TWICE DAILY   Multiple Vitamins-Minerals (MULTIVITAMINS THER. W/MINERALS) TABS Take 2 tablets by mouth daily.   omeprazole  (PRILOSEC) 40 MG capsule Take 1 capsule (40 mg total) by mouth every morning.   rosuvastatin  (CRESTOR ) 5 MG tablet TAKE 1 TABLET BY MOUTH 3 TIMES A WEEK   Vitamin D , Ergocalciferol , (DRISDOL ) 1.25 MG (50000 UNIT) CAPS capsule TAKE 1 CAPSULE ONE TIME WEEKLY   WEGOVY 0.5 MG/0.5ML SOAJ Inject 0.5 mg into the skin once a week.   No facility-administered encounter medications on file as of 09/17/2023.  Allergies (verified) Codeine   History: Past Medical History:  Diagnosis Date   Allergy     Anemia    Anxiety    xanax  used primarily for sleep    Arthritis    all over, cervical spine- stenosis    Asthma    last SOB was back in the summer, Never had attack   Cancer [facility])    right breast cancer 2000-had radiation   Cataract 06/2023   Surgery scheduled  10/02/2023   GERD (gastroesophageal reflux disease)    Hypertension    prompted by having palpitations to see Dr,. HILARIO Sharps, he only needs to follow up with her on PRN basis   Personal history of radiation therapy 2000   PONV (postoperative nausea and  vomiting)    Sleep apnea    has OSA- uses CPAP but not for last 2 weeks, due to sinus issues    Past Surgical History:  Procedure Laterality Date   ABDOMINAL HYSTERECTOMY     2004   ANTERIOR CERVICAL DECOMP/DISCECTOMY FUSION N/A 12/27/2013   Procedure: CERVICAL FOUR TO FIVE, CERVICAL FIVE TO SIX ANTERIOR CERVICAL DECOMPRESSION/DISCECTOMY FUSION 2 LEVELS;  Surgeon: Darina MALVA Boehringer, MD;  Location: [facility];  Service: Neurosurgery;  Laterality: N/A;   BACK SURGERY     BREAST BIOPSY Left 09/07/2013   x2   BREAST BIOPSY Left 12/13/2021   MM LT BREAST BX W LOC DEV 1ST LESION IMAGE BX SPEC STEREO GUIDE 12/13/2021 [facility]   BREAST BIOPSY Left 12/13/2021   MM LT BREAST BX W LOC DEV EA AD LESION IMG BX SPEC STEREO GUIDE 12/13/2021 [facility]   BREAST LUMPECTOMY Right 2000   BREAST SURGERY Right 2000   right lumpectomy 2000   COLONOSCOPY     FOOT GANGLION EXCISION     right and left at different times   FOOT SURGERY     3 yrs ago broke left foot had hardware inserted and then had to have it removed   KIDNEY SURGERY     whole repaired in kidney unknown which one at age 67   KNEE ARTHROSCOPY Left 2013   LUMBAR LAMINECTOMY/DECOMPRESSION MICRODISCECTOMY  12/24/2010   Procedure: LUMBAR LAMINECTOMY/DECOMPRESSION MICRODISCECTOMY;  Surgeon: Lynwood JONELLE Mill;  Location: [facility];  Service: Neurosurgery;  Laterality: Right;  RIGHT LUMBAR FOUR-FIVE LAMINECTOMY/DISCECTOMY   Family History  Problem Relation Age of Onset   Arthritis Mother    Asthma Father    Arthritis Sister    Heart disease Sister    Heart attack Sister    Heart disease Sister        heart attack x 2   COPD Brother    Heart disease Brother    Heart attack Brother        Open Heart   Heart disease Brother    Heart attack Brother        Open HEART   Heart disease Brother        bypass   Breast cancer Maternal Grandmother    Cancer Maternal Grandmother    Colon cancer Neg Hx    Colon polyps Neg Hx     Esophageal cancer Neg Hx    Rectal cancer Neg Hx    Stomach cancer Neg Hx    Social History   Socioeconomic History   Marital status: Divorced    Spouse name: Not on file   Number of children: Not on file   Years of education: Not on file   Highest education  level: Bachelor's degree (e.g., BA, AB, BS)  Occupational History   Occupation: Magazine features editor: [employer]  Tobacco Use   Smoking status: Never    Passive exposure: Past   Smokeless tobacco: Never   Tobacco comments:    Never smoked  Vaping Use   Vaping status: Never Used  Substance and Sexual Activity   Alcohol use: Yes    Alcohol/week: 2.0 standard drinks of alcohol    Types: 1 Glasses of wine, 1 Standard drinks or equivalent per week    Comment: Social drinks   Drug use: No   Sexual activity: Not Currently    Birth control/protection: None  Other Topics Concern   Not on file  Social History Narrative   Lives in [place]. Has a college education. 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 Divorced. No children. Non-smoker, social alcohol consumption.    Social Drivers of Corporate investment banker Strain: Low Risk  (09/17/2023)   Overall Financial Resource Strain (CARDIA)    Difficulty of Paying Living Expenses: Not very hard  Food Insecurity: No Food Insecurity (09/17/2023)   Hunger Vital Sign    Worried About Running Out of Food in the Last Year: Never true    Ran Out of Food in the Last Year: Never true  Transportation Needs: No Transportation Needs (09/17/2023)   PRAPARE - Administrator, Civil Service (Medical): No    Lack of Transportation (Non-Medical): No  Physical Activity: Insufficiently Active (09/17/2023)   Exercise Vital Sign    Days of Exercise per Week: 3 days    Minutes of Exercise per Session: 30 min  Stress: No Stress Concern Present (09/17/2023)   [employer] of  Occupational Health - Occupational Stress Questionnaire    Feeling of Stress: Not at all  Social Connections: Moderately Integrated (09/17/2023)   Social Connection and Isolation Panel    Frequency of Communication with Friends and Family: More than three times a week    Frequency of Social Gatherings with Friends and Family: More than three times a week    Attends Religious Services: More than 4 times per year    Active Member of [employer] or Organizations: Yes    Attends Engineer, structural: More than 4 times per year    Marital Status: Divorced    Tobacco Counseling Counseling given: No Tobacco comments: Never smoked   Clinical Intake:  Pre-visit preparation completed: Yes  Pain : 0-10 Pain Score: 7  Pain Location: Back Pain Orientation: Lower Pain Descriptors / Indicators: Cramping, Constant, Aching, Tingling Pain Onset: 1 to 4 weeks ago Pain Frequency: Several days a week     Nutritional Status: BMI > 30  Obese Nutritional Risks: None Diabetes: No  How often do you need to have someone help you when you read instructions, pamphlets, or other written materials from your doctor or pharmacy?: 1 - Never  Interpreter Needed?: No  Information entered by :: Kathlynn Porto, CMA   Activities of Daily Living    09/17/2023   10:30 AM 09/13/2023    8:56 AM  In your present state of health, do you have any difficulty performing the following activities:  Hearing? 0 0  Vision? 0 0  Difficulty concentrating or making decisions? 0 0  Walking or climbing stairs? 0 0  Dressing or bathing? 0 0  Doing errands, shopping? 0 0  Preparing Food and eating ?  N N  Using the Toilet? N N  In the past six months, have you accidently leaked urine? N N  Do you have problems with loss of bowel control? N N  Managing your Medications? N N  Managing your Finances? N N  Housekeeping or managing your Housekeeping? N N    Patient Care Team: Perri Ronal PARAS, MD as PCP - General  (Internal Medicine)  Indicate any recent Medical Services you may have received from other than [facility] providers in the past year (date may be approximate).     Assessment:   This is a routine wellness examination for Brittany Bender.   Goals Addressed   None    Depression Screen    09/17/2023   10:35 AM 06/26/2023    2:54 PM 06/20/2022    3:03 PM 03/25/2022    3:54 PM 12/11/2021    3:17 PM 06/18/2021   10:52 AM 06/15/2020   10:33 AM  PHQ 2/9 Scores  PHQ - 2 Score 0 0 0 0 0 0 0    Fall Risk    09/17/2023   10:34 AM 09/17/2023   10:32 AM 09/13/2023    8:56 AM 09/11/2023   11:16 AM 06/26/2023    2:54 PM  Fall Risk   Falls in the past year? 0 0 0 1 0  Number falls in past yr: 0 0 0 0 0  Injury with Fall? 0 0 0 0 0  Risk for fall due to : No Fall Risks No Fall Risks   No Fall Risks  Follow up Falls prevention discussed;Education provided;Falls evaluation completed Education provided;Falls prevention discussed;Falls evaluation completed   Falls prevention discussed;Education provided;Falls evaluation completed    MEDICARE RISK AT HOME: Medicare Risk at Home Any stairs in or around the home?: Yes If so, are there any without handrails?: No Home free of loose throw rugs in walkways, pet beds, electrical cords, etc?: Yes Adequate lighting in your home to reduce risk of falls?: Yes Life alert?: No Use of a cane, walker or w/c?: No Grab bars in the bathroom?: No Shower chair or bench in shower?: No Elevated toilet seat or a handicapped toilet?: No  TIMED UP AND GO:  Was the test performed?  No    Cognitive Function:        09/17/2023   10:32 AM 06/26/2023    2:54 PM 06/20/2022    3:04 PM 06/18/2021   10:53 AM  6CIT Screen  What Year? 0 points 0 points 0 points 0 points  What month? 0 points 0 points 0 points 0 points  What time? 0 points 0 points 0 points 0 points  Count back from 20 0 points 0 points 0 points 0 points  Months in reverse 0 points 0 points 0 points 0 points  Repeat  phrase 0 points 0 points 0 points 0 points  Total Score 0 points 0 points 0 points 0 points    Immunizations Immunization History  Administered Date(s) Administered   Influenza, Seasonal, Injecte, Preservative Fre 01/03/2023   Influenza,inj,Quad PF,6+ Mos 10/22/2012, 10/26/2013, 10/31/2014, 12/15/2015, 11/13/2016, 11/27/2017, 09/23/2018, 10/28/2019, 11/20/2020   Influenza-Unspecified 09/28/2013, 10/29/2014, 10/28/2021   PFIZER(Purple Top)SARS-COV-2 Vaccination 02/28/2019, 03/21/2019, 10/14/2019, 05/11/2020   PPD Test 07/04/2020   Pfizer Covid-19 Vaccine Bivalent Booster 72yrs & up 10/19/2020   Pneumococcal Conjugate-13 12/08/2017   Pneumococcal Polysaccharide-23 06/15/2020   Tdap 07/14/2020   Unspecified SARS-COV-2 Vaccination 11/04/2021    TDAP status: Up to date  Flu Vaccine status: Due, Education  has been provided regarding the importance of this vaccine. Advised may receive this vaccine at local pharmacy or Health Dept. Aware to provide a copy of the vaccination record if obtained from local pharmacy or Health Dept. Verbalized acceptance and understanding.  Pneumococcal vaccine status: Up to date  Covid-19 vaccine status: Information provided on how to obtain vaccines.   Qualifies for Shingles Vaccine? Yes   Zostavax completed No   Shingrix Completed?: No.    Education has been provided regarding the importance of this vaccine. Patient has been advised to call insurance company to determine out of pocket expense if they have not yet received this vaccine. Advised may also receive vaccine at local pharmacy or Health Dept. Verbalized acceptance and understanding.  Screening Tests Health Maintenance  Topic Date Due   Zoster Vaccines- Shingrix (1 of 2) Never done   COVID-19 Vaccine (7 - 2024-25 season) 09/29/2022   INFLUENZA VACCINE  08/29/2023   MAMMOGRAM  11/25/2023   Medicare Annual Wellness (AWV)  06/25/2024   Colonoscopy  11/05/2026   DTaP/Tdap/Td (2 - Td or Tdap)  07/15/2030   Pneumococcal Vaccine: 50+ Years  Completed   Hepatitis C Screening  Completed   HPV VACCINES  Aged Out   Meningococcal B Vaccine  Aged Out   DEXA SCAN  Discontinued    Health Maintenance  Health Maintenance Due  Topic Date Due   Zoster Vaccines- Shingrix (1 of 2) Never done   COVID-19 Vaccine (7 - 2024-25 season) 09/29/2022   INFLUENZA VACCINE  08/29/2023    Colorectal cancer screening: Type of screening: Colonoscopy. Completed 11/04/2016. Repeat every 10 years  Mammogram status: Completed 11/25/2022. Repeat every year  Lung Cancer Screening: (Low Dose CT Chest recommended if Age 51-80 years, 20 pack-year currently smoking OR have quit w/in 15years.) does not qualify.   Additional Screening:  Hepatitis C Screening: does not qualify; Completed   Vision Screening: Recommended annual ophthalmology exams for early detection of glaucoma and other disorders of the eye. Is the patient up to date with their annual eye exam?  Yes  Who is the provider or what is the name of the office in which the patient attends annual eye exams? [facility] If pt is not established with a provider, would they like to be referred to a provider to establish care? No .   Dental Screening: Recommended annual dental exams for proper oral hygiene  Community Resource Referral / Chronic Care Management: CRR required this visit?  No   CCM required this visit?  No     Plan:     I have personally reviewed and noted the following in the patient's chart:   Medical and social history Use of alcohol, tobacco or illicit drugs  Current medications and supplements including opioid prescriptions. Patient is not currently taking opioid prescriptions. Functional ability and status Nutritional status Physical activity Advanced directives List of other physicians Hospitalizations, surgeries, and ER visits in previous 12 months Vitals Screenings to include cognitive, depression, and  falls Referrals and appointments  In addition, I have reviewed and discussed with patient certain preventive protocols, quality metrics, and best practice recommendations. A written personalized care plan for preventive services as well as general preventive health recommendations were provided to patient.     Allianna Beaubien Zelda, CMA   09/17/2023   After Visit Summary: (Mail) Due to this being a telephonic visit, the after visit summary with patients personalized plan was offered to patient via mail

## 2023-10-02 DIAGNOSIS — H2511 Age-related nuclear cataract, right eye: Secondary | ICD-10-CM | POA: Diagnosis not present

## 2023-10-03 DIAGNOSIS — H25042 Posterior subcapsular polar age-related cataract, left eye: Secondary | ICD-10-CM | POA: Diagnosis not present

## 2023-10-03 DIAGNOSIS — H25012 Cortical age-related cataract, left eye: Secondary | ICD-10-CM | POA: Diagnosis not present

## 2023-10-03 DIAGNOSIS — H2512 Age-related nuclear cataract, left eye: Secondary | ICD-10-CM | POA: Diagnosis not present

## 2023-10-06 ENCOUNTER — Other Ambulatory Visit: Payer: Self-pay

## 2023-10-06 ENCOUNTER — Encounter: Payer: Self-pay | Admitting: Internal Medicine

## 2023-10-06 MED ORDER — ROSUVASTATIN CALCIUM 5 MG PO TABS: ORAL_TABLET | ORAL | 1 refills | Status: DC

## 2023-10-08 DIAGNOSIS — H2511 Age-related nuclear cataract, right eye: Secondary | ICD-10-CM | POA: Diagnosis not present

## 2023-10-13 ENCOUNTER — Other Ambulatory Visit: Payer: Self-pay | Admitting: Internal Medicine

## 2023-10-13 DIAGNOSIS — Z1231 Encounter for screening mammogram for malignant neoplasm of breast: Secondary | ICD-10-CM

## 2023-10-16 DIAGNOSIS — H2512 Age-related nuclear cataract, left eye: Secondary | ICD-10-CM | POA: Diagnosis not present

## 2023-10-23 DIAGNOSIS — H2512 Age-related nuclear cataract, left eye: Secondary | ICD-10-CM | POA: Diagnosis not present

## 2023-11-27 ENCOUNTER — Other Ambulatory Visit: Payer: Self-pay | Admitting: Internal Medicine

## 2023-11-28 ENCOUNTER — Other Ambulatory Visit: Payer: Self-pay | Admitting: Internal Medicine

## 2023-12-01 ENCOUNTER — Ambulatory Visit
Admission: RE | Admit: 2023-12-01 | Discharge: 2023-12-01 | Disposition: A | Discharge status: Home / Self Care | Source: Ambulatory Visit | Attending: Internal Medicine | Admitting: Internal Medicine

## 2023-12-01 DIAGNOSIS — Z1231 Encounter for screening mammogram for malignant neoplasm of breast: Secondary | ICD-10-CM | POA: Diagnosis not present

## 2023-12-06 ENCOUNTER — Other Ambulatory Visit: Payer: Self-pay | Admitting: Obstetrics

## 2023-12-06 DIAGNOSIS — B9689 Other specified bacterial agents as the cause of diseases classified elsewhere: Secondary | ICD-10-CM

## 2023-12-06 MED ORDER — AZITHROMYCIN 250 MG PO TABS: ORAL_TABLET | ORAL | 0 refills | Status: DC

## 2023-12-15 NOTE — Patient Instructions (Incomplete)
  1. Allergen avoidance measures - dust mite  2. Treat and prevent airway inflammation:   A. Fluticasone  - 2 sprays each nostril 3-7 times per week  B. Arnuity 100 - 1 inhalation 3-7 times per week. For now use 1 puff once a day every day. Rinse mouth out after  3. Treat and prevent reflux induced airway inflammation:   A.  For now re-start Omeprazole  40 mg - 1 tablet 1 in AM  4. If needed:   A. Albuterol  - 2 inhalations every 4-6 hours OR 1 unit dose via nebulizer every 4-6 hours as needed for cough, wheeze, tightness in chest, or shortness of breath  B. OTC antihistamine  C. Famotidine  40mg  - 1 tablet in PM  Start Zpak for cough. Let me know if you do not get better. Nebulizer given with demonstration on how to put together  5. Return to clinic in 6 months or earlier if needed

## 2023-12-16 ENCOUNTER — Encounter: Payer: Self-pay | Admitting: Family

## 2023-12-16 ENCOUNTER — Ambulatory Visit: Admitting: Family

## 2023-12-16 ENCOUNTER — Other Ambulatory Visit: Payer: Self-pay

## 2023-12-16 VITALS — BP 110/80 | HR 89 | Temp 98.7°F

## 2023-12-16 DIAGNOSIS — J4530 Mild persistent asthma, uncomplicated: Secondary | ICD-10-CM

## 2023-12-16 DIAGNOSIS — K219 Gastro-esophageal reflux disease without esophagitis: Secondary | ICD-10-CM | POA: Diagnosis not present

## 2023-12-16 DIAGNOSIS — J3089 Other allergic rhinitis: Secondary | ICD-10-CM | POA: Diagnosis not present

## 2023-12-16 DIAGNOSIS — J45909 Unspecified asthma, uncomplicated: Secondary | ICD-10-CM | POA: Diagnosis not present

## 2023-12-16 DIAGNOSIS — J45998 Other asthma: Secondary | ICD-10-CM | POA: Diagnosis not present

## 2023-12-16 MED ORDER — ALBUTEROL SULFATE (2.5 MG/3ML) 0.083% IN NEBU: 2.5000 mg | INHALATION_SOLUTION | Freq: Four times a day (QID) | RESPIRATORY_TRACT | 1 refills | Status: DC | PRN

## 2023-12-16 MED ORDER — AZITHROMYCIN 250 MG PO TABS: ORAL_TABLET | ORAL | 0 refills | Status: DC

## 2023-12-16 NOTE — Progress Notes (Signed)
 [address] [place] [address] Dept: [phone]  FOLLOW UP NOTE  Patient ID: Brittany Bender, female    DOB: 1950/10/04  Age: 73 y.o. MRN: [id number] Date of Office Visit: 12/16/2023  Assessment  Chief Complaint: Cough (X 1 month after vacation)  HPI Brittany Bender is a 73 year old female who presents today for an acute visit of cough.  She was last seen on December 17, 2022 by Dr. Kozlow for well-controlled mild persistent asthma, perennial allergic rhinitis, and laryngopharyngeal reflux disease.  She reports since her last office visit she had cataracts removed on September 4 and then September 18.  She reports for the past month she has had a cough after being on vacation.  This happened the same time last year and she thinks maybe a Z-Pak helped her get better. She has not needed an antibiotic in awhile. The Zpak that was called in earlier this month was given to a friend and she did not take.  She has used her albuterol  3 times and she thinks it has helped cough.  She has used her Arnuity 100 mcg 1 puff once a day maybe twice a week.  She uses this when she feels tight.  The cough can be dry and productive.  The sputum is clear in color when she gets it up.  The cough is worse at night and wakes her up like she is choking.  It is a deep hard cough.  She does have some wheezing at night.  She denies fever, chills, tightness in her chest, and does not really feel short of breath.  Since her last office visit she has not required any trips to the emergency room or urgent care due to breathing problems.  The only steroid she received has received was eyedrops after her cataract surgery.  She feels like a nebulizer may be beneficial to have at home.  Reflux: She stopped taking omeprazole  40 mg once a day a while ago due to reading the bad reviews.  She may take a Pepcid  if she is knows that she is going to eat something that is going to other her reflux.  Allergic rhinitis: She reports  clear rhinorrhea and nasal congestion.  She does not think she has postnasal drip.  She has not been treated for any sinus infections since we last saw her.  She does use fluticasone  nasal spray as needed and is not taking any antihistamines.   Drug Allergies:  Allergies  Allergen Reactions   Codeine Other (See Comments)    Headache    Review of Systems: Negative except as per HPI   Physical Exam: BP 110/80   Pulse 89   Temp 98.7 F (37.1 C)   SpO2 97%    Physical Exam Constitutional:      Appearance: Normal appearance.  HENT:     Head: Normocephalic and atraumatic.     Comments: Pharynx normal. Eyes normal. Ears normal. Nose: bilateral lower turbinates mildly edematous with no drainage noted.    Right Ear: Tympanic membrane, ear canal and external ear normal.     Left Ear: Tympanic membrane, ear canal and external ear normal.     Mouth/Throat:     Mouth: Mucous membranes are moist.     Pharynx: Oropharynx is clear.  Eyes:     Conjunctiva/sclera: Conjunctivae normal.  Cardiovascular:     Rate and Rhythm: Regular rhythm.     Heart sounds: Normal heart sounds.  Pulmonary:  Effort: Pulmonary effort is normal.     Breath sounds: Normal breath sounds.     Comments: Lungs clear to auscultation Musculoskeletal:     Cervical back: Neck supple.  Skin:    General: Skin is warm.  Neurological:     Mental Status: She is alert and oriented to person, place, and time.  Psychiatric:        Mood and Affect: Mood normal.        Behavior: Behavior normal.        Thought Content: Thought content normal.        Judgment: Judgment normal.     Diagnostics: FVC 2.19 L (91%), FEV1 1.83 L (97%), FEV1/FVC 0.84.  Spirometry indicates normal spirometry. 4 puffs of Xopenex given. Post bronchodilator response shows FVC 1.99 L (82%), FEV1 1.66 L (88%), FEV1/FVC.  There is no change in FEV1.  Spirometry indicates normal spirometry.  Assessment and Plan: 1. Not well controlled mild  persistent asthma   2. Perennial allergic rhinitis   3. LPRD (laryngopharyngeal reflux disease)     Meds ordered this encounter  Medications   albuterol  (PROVENTIL ) (2.5 MG/3ML) 0.083% nebulizer solution    Sig: Take 3 mLs (2.5 mg total) by nebulization every 6 (six) hours as needed for wheezing or shortness of breath.    Dispense:  75 mL    Refill:  1   azithromycin  (ZITHROMAX  Z-PAK) 250 MG tablet    Sig: Take 2 tablets on the first day, then take 1 tablet po daily.    Dispense:  6 each    Refill:  0    Patient Instructions   1. Allergen avoidance measures - dust mite  2. Treat and prevent airway inflammation:   A. Fluticasone  - 2 sprays each nostril 3-7 times per week  B. Arnuity 100 - 1 inhalation 3-7 times per week. For now use 1 puff once a day every day. Rinse mouth out after  3. Treat and prevent reflux induced airway inflammation:   A.  For now re-start Omeprazole  40 mg - 1 tablet 1 in AM  4. If needed:   A. Albuterol  - 2 inhalations every 4-6 hours OR 1 unit dose via nebulizer every 4-6 hours as needed for cough, wheeze, tightness in chest, or shortness of breath  B. OTC antihistamine  C. Famotidine  40mg  - 1 tablet in PM  Start Zpak for cough. Let me know if you do not get better. Nebulizer given with demonstration on how to put together  5. Return to clinic in 6 months or earlier if needed  Return in about 6 months (around 06/14/2024).    Thank you for the opportunity to care for this patient.  Please do not hesitate to contact me with questions.  Wanda Craze, FNP [facility] 

## 2023-12-22 NOTE — Progress Notes (Signed)
 Patient Care Team: Perri Ronal PARAS, MD as PCP - General (Internal Medicine)  Visit Date: 01/02/24  Subjective:    Patient ID: Brittany Bender , Female   DOB: 03/28/1950, 73 y.o.    MRN: [id number]   73 y.o. Female presents today for 6 month follow up Hypertension, Hyperlipidemia. Patient has a past medical history of Obesity, Asthma, Allergic rhinitis, GE Reflux, Anxiety, Neutropenia, Vitamin D  deficiency, OSA, Constipation.   She has had a persistent cough since she became ill with a respiratory infection on a cruise in October 2024. Is seen by Allergist, Dr. Camellia Denis. She is going on another cruise over the holidays and wanted medication to have on hand in case she develops another respiratory infection on upcoming cruise.  Today have prescribed Zithromax  Z pak  for  upcoming travel/ cruise.  She has been having shoulder pain and she is unsure if it is related to the way that she is sleeping. Continue to monitor symptoms. See Orthopedist if symptoms persist.  History of Hypertension; Palpitations treated with Metoprolol  tartrate 25 mg twice daily. Blood Pressure is normal at 130/80.  2015 Stress test negative; 2D echocardiogram showed normal ejection fracture but diastolic dysfunction.Coronary calcium  score 2024 was 173.   History of Hyperlipidemia treated with  Rosuvastatin  5 mg three times weekly. 12/29/2023 Lipid panel normal. 01/20/2023 Coronary Calcium  Score: 173.    History of Obesity; BMI 45.49 265 pounds today; Impaired Glucose Tolerance 12/29/2023 HgbA1c 5.7%.    History of Asthma; Allergic Rhinitis treated with Albuterol  inhaler as needed, Arnuity Ellipta  inhaler daily, Flonase  3-7x/week, and Xyzal  5 - 10 mg daily as needed.   History of GERD treated with Omeprazole  40 mg daily and Pepcid  40 mg at bedtime   History of Anxiety treated with Xanax  0.5 mg as needed   History of benign neutropenia.   Hx of folic acid  deficiency 2021. Level was 4.6. Takes Folvite .  History  of Vitamin-D Deficiency treated with Vitamin-D 50,000 units weekly.   History of Obstructive Sleep Apnea treated with CPAP nightly.    History of Constipation treated with Linzess  20 mcg daily.   Vaccine counseling: Influenza vaccine received today.     Past Medical History:  Diagnosis Date   Allergy     Anemia    Anxiety    xanax  used primarily for sleep    Arthritis    all over, cervical spine- stenosis    Asthma    last SOB was back in the summer, Never had attack   Cancer [facility])    right breast cancer 2000-had radiation   Cataract 06/2023   Surgery scheduled  10/02/2023   GERD (gastroesophageal reflux disease)    Hypertension    prompted by having palpitations to see Dr,. HILARIO Sharps, he only needs to follow up with her on PRN basis   Personal history of radiation therapy 2000   PONV (postoperative nausea and vomiting)    Sleep apnea    has OSA- uses CPAP but not for last 2 weeks, due to sinus issues      Family History  Problem Relation Age of Onset   Arthritis Mother    Asthma Father    Arthritis Sister    Heart disease Sister    Heart attack Sister    Heart disease Sister        heart attack x 2   COPD Brother    Heart disease Brother    Heart attack Brother  Open Heart   Heart disease Brother    Heart attack Brother        Open HEART   Heart disease Brother        bypass   Breast cancer Maternal Grandmother    Cancer Maternal Grandmother    Colon cancer Neg Hx    Colon polyps Neg Hx    Esophageal cancer Neg Hx    Rectal cancer Neg Hx    Stomach cancer Neg Hx     Social History   Social History Narrative   Lives in [place]. Has a college education. 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 Divorced. No children. Non-smoker, social alcohol consumption.       Review of Systems  Constitutional:  Negative for fever and malaise/fatigue.  HENT:  Negative for  congestion.   Eyes:  Negative for blurred vision.  Respiratory:  Positive for cough. Negative for shortness of breath.   Cardiovascular:  Negative for chest pain, palpitations and leg swelling.  Gastrointestinal:  Negative for vomiting.  Musculoskeletal:  Negative for back pain.  Skin:  Negative for rash.  Neurological:  Negative for loss of consciousness and headaches.        Objective:   Vitals: BP 130/80   Pulse 94   Ht 5' 4 (1.626 m)   Wt 265 lb (120.2 kg)   SpO2 98%   BMI 45.49 kg/m    Physical Exam Vitals and nursing note reviewed.  Constitutional:      General: She is not in acute distress.    Appearance: Normal appearance. She is not toxic-appearing.  HENT:     Head: Normocephalic and atraumatic.  Cardiovascular:     Rate and Rhythm: Normal rate and regular rhythm. No extrasystoles are present.    Pulses: Normal pulses.     Heart sounds: Normal heart sounds. No murmur heard.    No friction rub. No gallop.  Pulmonary:     Effort: Pulmonary effort is normal. No respiratory distress.     Breath sounds: Normal breath sounds. No wheezing or rales.  Skin:    General: Skin is warm and dry.  Neurological:     Mental Status: She is alert and oriented to person, place, and time. Mental status is at baseline.  Psychiatric:        Mood and Affect: Mood normal.        Behavior: Behavior normal.        Thought Content: Thought content normal.        Judgment: Judgment normal.       Results:    Labs:       Component Value Date/Time   NA 141 06/19/2023 0931   NA 141 04/24/2023 1550   K 4.0 06/19/2023 0931   CL 107 06/19/2023 0931   CO2 27 06/19/2023 0931   GLUCOSE 88 06/19/2023 0931   BUN 11 06/19/2023 0931   BUN 10 04/24/2023 1550   CREATININE 0.67 06/19/2023 0931   CALCIUM  8.8 06/19/2023 0931   PROT 7.4 12/29/2023 0935   PROT 7.5 04/24/2023 1550   ALBUMIN 4.3 04/24/2023 1550   AST 17 12/29/2023 0935   AST 11 (L) 09/21/2019 0941   ALT 9 12/29/2023  0935   ALT 8 09/21/2019 0941   ALKPHOS 79 04/24/2023 1550   BILITOT 0.5 12/29/2023 0935   BILITOT 0.3 04/24/2023 1550   BILITOT 0.4 09/21/2019 0941   GFRNONAA 89 06/13/2020  9091   GFRAA 103 06/13/2020 0908     Lab Results  Component Value Date   WBC 3.1 (L) 06/19/2023   HGB 11.8 06/19/2023   HCT 36.2 06/19/2023   MCV 93.3 06/19/2023   PLT 201 06/19/2023    Lab Results  Component Value Date   CHOL 160 12/29/2023   HDL 79 12/29/2023   LDLCALC 66 12/29/2023   TRIG 66 12/29/2023   CHOLHDL 2.0 12/29/2023    Lab Results  Component Value Date   HGBA1C 5.7 (H) 12/29/2023     Lab Results  Component Value Date   TSH 1.05 06/19/2023        Assessment & Plan:   Meds ordered this encounter  Medications   HYDROcodone  bit-homatropine (HYCODAN) 5-1.5 MG/5ML syrup    Sig: Take 5 mLs by mouth every 8 (eight) hours as needed for cough.    Dispense:  120 mL    Refill:  0   azithromycin  (ZITHROMAX  Z-PAK) 250 MG tablet    Sig: Take 2 tablets on the first day, then take 1 tablet po daily.    Dispense:  6 each    Refill:  0    Persistent Cough: She has had a cough since she got sick on a cruise in October. Is seen by Allergist, Dr. Camellia Denis. She is going on another cruise over the holidays and wanted medication in case she became ill on cruise again.    Hycodan 5 mL every 8 hours as needed prescribed   Azithromycin  250 mg 2 tablets on day 1, one tablet daily on days 2-5.    Hypertension;/intermittent tachycardia/ Palpitations: treated with Metoprolol  tartrate 25 mg twice daily. Blood Pressure is normal at 130/80.2015 Stress test negative; 2D echocardiogram showed normal ejection fracture but diastolic dysfunction. Saw Dr. Sheena In March.Continue metoprolol .   Hyperlipidemia: treated with  Rosuvastatin  5 mg three times weekly. 12/29/2023 Lipid panel normal. 01/20/2023 Coronary Calcium  Score: 173.    Obesity: BMI 45.49 265 pounds today; Impaired Glucose Tolerance 12/29/2023  HgbA1c 5.7%.    Asthma; Allergic Rhinitis: treated with Albuterol  inhaler as needed, Arnuity Ellipta  inhaler daily, Flonase  3-7x/week, and Xyzal  5 - 10 mg daily as needed.   GE Reflux: treated with Omeprazole  40 mg daily and Pepcid  40 mg at bedtime   Anxiety: treated with Xanax  0.5 mg as needed   Neutropenia: treated with Folvite  1 mg daily.     Vitamin-D Deficiency: treated with Vitamin-D 50,000 units weekly.   Obstructive Sleep Apnea: treated with CPAP nightly.    Constipation: treated with Linzess  20 mcg daily.   Vaccine counseling: Influenza vaccine received today.   I,Makayla C Reid,acting as a scribe for Ronal JINNY Hailstone, MD.,have documented all relevant documentation on the behalf of Ronal JINNY Hailstone, MD,as directed by  Ronal JINNY Hailstone, MD while in the presence of Ronal JINNY Hailstone, MD.  I, Ronal JINNY Hailstone, MD, have reviewed all documentation for this visit. The documentation on 01/02/2024 for the exam, diagnosis, procedures, and orders are all accurate and complete.

## 2023-12-29 ENCOUNTER — Other Ambulatory Visit: Payer: Self-pay

## 2023-12-29 DIAGNOSIS — R7302 Impaired glucose tolerance (oral): Secondary | ICD-10-CM | POA: Diagnosis not present

## 2023-12-29 DIAGNOSIS — E7800 Pure hypercholesterolemia, unspecified: Secondary | ICD-10-CM

## 2023-12-29 LAB — HEMOGLOBIN A1C
Hgb A1c MFr Bld: 5.7 % — ABNORMAL HIGH (ref <5.7)
Mean Plasma Glucose: 117 mg/dL
eAG (mmol/L): 6.5 mmol/L

## 2023-12-29 LAB — LIPID PANEL
Cholesterol: 160 mg/dL (ref <200)
HDL: 79 mg/dL (ref >=50)
LDL Cholesterol (Calc): 66 mg/dL
Non-HDL Cholesterol (Calc): 81 mg/dL (ref <130)
Total CHOL/HDL Ratio: 2 (calc) (ref <5.0)
Triglycerides: 66 mg/dL (ref <150)

## 2023-12-29 LAB — HEPATIC FUNCTION PANEL
AG Ratio: 1.2 (calc) (ref 1.0–2.5)
ALT: 9 U/L (ref 6–29)
AST: 17 U/L (ref 10–35)
Albumin: 4 g/dL (ref 3.6–5.1)
Alkaline phosphatase (APISO): 63 U/L (ref 37–153)
Bilirubin, Direct: 0.1 mg/dL (ref 0.0–0.2)
Globulin: 3.4 g/dL (ref 1.9–3.7)
Indirect Bilirubin: 0.4 mg/dL (ref 0.2–1.2)
Total Bilirubin: 0.5 mg/dL (ref 0.2–1.2)
Total Protein: 7.4 g/dL (ref 6.1–8.1)

## 2024-01-02 ENCOUNTER — Encounter: Payer: Self-pay | Admitting: Internal Medicine

## 2024-01-02 ENCOUNTER — Ambulatory Visit: Payer: Self-pay | Admitting: Internal Medicine

## 2024-01-02 VITALS — BP 130/80 | HR 94 | Ht 64.0 in | Wt 265.0 lb

## 2024-01-02 DIAGNOSIS — Z8659 Personal history of other mental and behavioral disorders: Secondary | ICD-10-CM

## 2024-01-02 DIAGNOSIS — Z23 Encounter for immunization: Secondary | ICD-10-CM

## 2024-01-02 DIAGNOSIS — R7302 Impaired glucose tolerance (oral): Secondary | ICD-10-CM

## 2024-01-02 DIAGNOSIS — G4733 Obstructive sleep apnea (adult) (pediatric): Secondary | ICD-10-CM | POA: Diagnosis not present

## 2024-01-02 DIAGNOSIS — Z6841 Body Mass Index (BMI) 40.0 and over, adult: Secondary | ICD-10-CM | POA: Diagnosis not present

## 2024-01-02 DIAGNOSIS — Z67A1 Duffy null: Secondary | ICD-10-CM

## 2024-01-02 DIAGNOSIS — R053 Chronic cough: Secondary | ICD-10-CM

## 2024-01-02 DIAGNOSIS — E7800 Pure hypercholesterolemia, unspecified: Secondary | ICD-10-CM

## 2024-01-02 DIAGNOSIS — K219 Gastro-esophageal reflux disease without esophagitis: Secondary | ICD-10-CM | POA: Diagnosis not present

## 2024-01-02 DIAGNOSIS — J4520 Mild intermittent asthma, uncomplicated: Secondary | ICD-10-CM

## 2024-01-02 DIAGNOSIS — E559 Vitamin D deficiency, unspecified: Secondary | ICD-10-CM

## 2024-01-02 DIAGNOSIS — I5189 Other ill-defined heart diseases: Secondary | ICD-10-CM

## 2024-01-02 DIAGNOSIS — K5909 Other constipation: Secondary | ICD-10-CM

## 2024-01-02 DIAGNOSIS — Z853 Personal history of malignant neoplasm of breast: Secondary | ICD-10-CM | POA: Diagnosis not present

## 2024-01-02 DIAGNOSIS — Z8639 Personal history of other endocrine, nutritional and metabolic disease: Secondary | ICD-10-CM

## 2024-01-02 DIAGNOSIS — Z90710 Acquired absence of both cervix and uterus: Secondary | ICD-10-CM | POA: Diagnosis not present

## 2024-01-02 MED ORDER — HYDROCODONE BIT-HOMATROP MBR 5-1.5 MG/5ML PO SOLN: 5.0000 mL | Freq: Three times a day (TID) | ORAL | 0 refills | Status: AC | PRN

## 2024-01-02 MED ORDER — AZITHROMYCIN 250 MG PO TABS: ORAL_TABLET | ORAL | 0 refills | Status: AC

## 2024-01-02 NOTE — Patient Instructions (Addendum)
 Continue current meds. Z-pak prescribed for upcoming cruise if needed. Labs are stable. CPE due in Spring 2026.

## 2024-01-05 ENCOUNTER — Other Ambulatory Visit: Payer: Self-pay | Admitting: Allergy and Immunology

## 2024-01-05 ENCOUNTER — Encounter: Payer: Self-pay | Admitting: Internal Medicine

## 2024-01-08 NOTE — Telephone Encounter (Signed)
 done

## 2024-01-09 ENCOUNTER — Ambulatory Visit: Admitting: Internal Medicine

## 2024-01-26 ENCOUNTER — Other Ambulatory Visit: Payer: Self-pay | Admitting: Allergy and Immunology

## 2024-02-03 NOTE — Patient Instructions (Incomplete)
" °  1. Allergen avoidance measures - dust mite  2. Treat and prevent airway inflammation:   A. Fluticasone  - 2 sprays each nostril 3-7 times per week  B. Arnuity 100 - 1 inhalation 3-7 times per week.   3. Treat and prevent reflux induced airway inflammation:   A.  Continue omeprazole  40 mg - 1 tablet 1 in AM  4. If needed:   A. Albuterol  - 2 inhalations every 4-6 hours OR 1 unit dose via nebulizer every 4-6 hours as needed for cough, wheeze, tightness in chest, or shortness of breath  B. OTC antihistamine  C. Famotidine  40mg  - 1 tablet in PM   5. Return to clinic in  months or earlier if needed "

## 2024-02-04 ENCOUNTER — Other Ambulatory Visit: Payer: Self-pay

## 2024-02-04 ENCOUNTER — Ambulatory Visit: Admitting: Family

## 2024-02-04 ENCOUNTER — Encounter: Payer: Self-pay | Admitting: Family

## 2024-02-04 VITALS — BP 122/80 | HR 77 | Temp 98.0°F | Resp 20 | Ht 64.0 in | Wt 269.1 lb

## 2024-02-04 DIAGNOSIS — K219 Gastro-esophageal reflux disease without esophagitis: Secondary | ICD-10-CM

## 2024-02-04 DIAGNOSIS — J4530 Mild persistent asthma, uncomplicated: Secondary | ICD-10-CM | POA: Diagnosis not present

## 2024-02-04 DIAGNOSIS — R053 Chronic cough: Secondary | ICD-10-CM

## 2024-02-04 MED ORDER — ALBUTEROL SULFATE (2.5 MG/3ML) 0.083% IN NEBU: 2.5000 mg | INHALATION_SOLUTION | Freq: Four times a day (QID) | RESPIRATORY_TRACT | 1 refills | Status: AC | PRN

## 2024-02-04 MED ORDER — FAMOTIDINE 40 MG PO TABS: 40.0000 mg | ORAL_TABLET | Freq: Every evening | ORAL | 1 refills | Status: AC

## 2024-02-04 MED ORDER — FLUTICASONE PROPIONATE 50 MCG/ACT NA SUSP: NASAL | 1 refills | Status: AC

## 2024-02-04 NOTE — Progress Notes (Signed)
 "  [address] [place] [address] Dept: [phone]  FOLLOW UP NOTE  Patient ID: Brittany Bender, female    DOB: [birth year]  Age: 74 y.o. MRN: [id number] Date of Office Visit: 02/04/2024  Assessment  Chief Complaint: Cough, Wheezing, and Nasal Congestion  HPI Brittany Bender is a 74 year old female who presents today for acute visit of continued cough and wheeze.  She was last seen on December 16, 2023 by myself for not well-controlled mild persistent asthma, perennial allergic rhinitis, and laryngopharyngeal reflux disease.  She denies any new diagnosis or surgeries since her last office visit.  She reports that she did not get any better after her last office visit and taking the azithromycin  that she was prescribed.  She reports her cough is so bad now where she will pee at night.  She is also wheezing at night.  During the day she is okay.  The cough is productive now with a little bit of yellow-green sputum, but this is not occurring often.  She denies tightness in chest, shortness of breath, fever, and chills.  She reports that she is tired, but mentions she is not sleeping at night due to the cough.  Her primary care physician gave her another azithromycin  pack that she took while on the cruise  and finished around December 27.  What has helped some with the cough is a hydrocodone  cough syrup prescribed by her primary care physician.  She continues to take Arnuity 100 mcg 1 puff once a day.  She used her albuterol  every day for 1 to week and could not tell that it helped any with the cough or the wheezing.  She does feel like strong smells cause her symptoms to be worse.  Also if she is hot it makes her symptoms worse.  Since her last office visit she has not made any trips to the emergency room or urgent care due to breathing problems and has not required any systemic steroids. Her friends have heard her wheezing over the phone.   Perennial allergic rhinitis: She reports nasal  congestion, but not very much.  She denies rhinorrhea and postnasal drip.  She does take fluticasone  nasal spray as needed.  She has not been treated for any sinus infections since we last saw her.  Laryngopharyngeal reflux disease: She is currently taking omeprazole  in the morning, but not heard good things about this medication.  She does try to avoid the foods that bother her.  She denies any heartburn or reflux, but sometimes after she eats she will feel something in her throat.  She has not been taking famotidine  40 mg at night   Drug Allergies:  Allergies[1]  Review of Systems: Negative except as per HPI   Physical Exam: BP 122/80 (BP Location: Left Arm, Patient Position: Sitting, Cuff Size: Large)   Pulse 77   Temp 98 F (36.7 C) (Temporal)   Resp 20   Ht 5' 4 (1.626 m)   Wt 269 lb 1.6 oz (122.1 kg)   SpO2 96%   BMI 46.19 kg/m    Physical Exam Constitutional:      Appearance: Normal appearance.  HENT:     Head: Normocephalic and atraumatic.     Comments: Pharynx normal. Eyes normal. Ears normal. Nose normal    Right Ear: Tympanic membrane, ear canal and external ear normal.     Left Ear: Tympanic membrane, ear canal and external ear normal.     Nose: Nose normal.  Mouth/Throat:     Mouth: Mucous membranes are moist.     Pharynx: Oropharynx is clear.  Eyes:     Conjunctiva/sclera: Conjunctivae normal.  Cardiovascular:     Rate and Rhythm: Regular rhythm.     Heart sounds: Normal heart sounds.  Pulmonary:     Effort: Pulmonary effort is normal.     Breath sounds: Normal breath sounds.     Comments: Lungs clear to auscultaiton Musculoskeletal:     Cervical back: Neck supple.  Skin:    General: Skin is warm.  Neurological:     Mental Status: She is alert and oriented to person, place, and time.  Psychiatric:        Mood and Affect: Mood normal.        Behavior: Behavior normal.        Thought Content: Thought content normal.        Judgment: Judgment  normal.     Diagnostics: FVC 2.02 L (84%), FEV1 1.62 L (87%), FEV1/FVC 0.80.  Spirometry indicates normal spirometry. 4 puffs of Xopenex given. FVC 1.75 L (73%), FEV1 1.40 L (75%), FEV1/FVC 0.80. Spirometry indicates normal spirometry .No change in FEV1 noted.  Assessment and Plan: 1. Chronic cough   2. Mild persistent asthma without complication   3. LPRD (laryngopharyngeal reflux disease)     Meds ordered this encounter  Medications   albuterol  (PROVENTIL ) (2.5 MG/3ML) 0.083% nebulizer solution    Sig: Take 3 mLs (2.5 mg total) by nebulization every 6 (six) hours as needed for wheezing or shortness of breath.    Dispense:  75 mL    Refill:  1   famotidine  (PEPCID ) 40 MG tablet    Sig: Take 1 tablet (40 mg total) by mouth at bedtime.    Dispense:  90 tablet    Refill:  1   fluticasone  (FLONASE ) 50 MCG/ACT nasal spray    Sig: 2 sprays each nostril 3-7 times per week    Dispense:  46 g    Refill:  1    Patient Instructions   1. Allergen avoidance measures - dust mite  2. Treat and prevent airway inflammation:   A. Fluticasone  - 2 sprays each nostril 3-7 times per week  B.  STOP Arnuity 100 -  for now and start Breztri 2 puffs twice a day with a spacer. Rinse mouth after.2 Samples given along with a spacer  C. I will order a chest x-ray due to your chronic cough. We will call you with results once they are back  D. Consider vocal cord dysfunction due to strong scents causing symptoms  3. Treat and prevent reflux induced airway inflammation:   A.  Continue omeprazole  40 mg - 1 tablet 1 in AM  4. If needed:   A. Albuterol  - 2 inhalations every 4-6 hours OR 1 unit dose via nebulizer every 4-6 hours as needed for cough, wheeze, tightness in chest, or shortness of breath  B. OTC antihistamine  C.  Start famotidine  40mg  - 1 tablet in PM   5. Return to clinic in  4 weeks with Dr. Kozlow or earlier if needed  Return in about 4 weeks (around 03/03/2024), or if symptoms worsen  or fail to improve.    Thank you for the opportunity to care for this patient.  Please do not hesitate to contact me with questions.  Wanda Craze, FNP [facility]         [1]  Allergies Allergen Reactions  Codeine Other (See Comments)    Headache   "

## 2024-02-19 ENCOUNTER — Ambulatory Visit
Admission: RE | Admit: 2024-02-19 | Discharge: 2024-02-19 | Disposition: A | Discharge status: Home / Self Care | Source: Ambulatory Visit | Attending: Family | Admitting: Family

## 2024-02-23 ENCOUNTER — Ambulatory Visit: Payer: Self-pay | Admitting: Family

## 2024-02-23 NOTE — Progress Notes (Signed)
 Please let Brittany Bender know that her chest x-ay shows no acute cardiopulmonary abnormality. How is her cough since starting the Breztri inhaler and adding famotidine  at night?

## 2024-02-24 NOTE — Progress Notes (Signed)
 Patient was called and confirmed that she does not have a fever or drainage that is causing her to cough. She prefers to keep the appointment she has schedule that's coming up. I did make her aware if things change to call us  back.

## 2024-02-24 NOTE — Progress Notes (Signed)
 Does she have any drainage down her throat causing the cough? She could see Dr. Kozlow today if she is able to safely get out.

## 2024-02-24 NOTE — Progress Notes (Signed)
 Thank you :)

## 2024-03-02 ENCOUNTER — Other Ambulatory Visit: Payer: Self-pay | Admitting: Internal Medicine

## 2024-03-09 ENCOUNTER — Ambulatory Visit: Admitting: Allergy and Immunology

## 2024-05-28 ENCOUNTER — Ambulatory Visit: Admitting: Cardiology

## 2024-07-02 ENCOUNTER — Other Ambulatory Visit
